# Patient Record
Sex: Male | Born: 1960 | Race: White | Hispanic: No | Marital: Married | Smoking: Never smoker
Health system: Southern US, Community
[De-identification: ages and names within clinical notes are randomized; demographics above are authoritative.]

## PROBLEM LIST (undated history)

## (undated) DIAGNOSIS — T7840XA Allergy, unspecified, initial encounter: Secondary | ICD-10-CM

## (undated) DIAGNOSIS — R040 Epistaxis: Secondary | ICD-10-CM

## (undated) DIAGNOSIS — R739 Hyperglycemia, unspecified: Secondary | ICD-10-CM

## (undated) DIAGNOSIS — N400 Enlarged prostate without lower urinary tract symptoms: Secondary | ICD-10-CM

## (undated) HISTORY — PX: ANTERIOR CRUCIATE LIGAMENT REPAIR: SHX115

## (undated) HISTORY — DX: Benign prostatic hyperplasia without lower urinary tract symptoms: N40.0

## (undated) HISTORY — PX: TONSILLECTOMY: SUR1361

## (undated) HISTORY — DX: Allergy, unspecified, initial encounter: T78.40XA

## (undated) HISTORY — DX: Hyperglycemia, unspecified: R73.9

## (undated) HISTORY — DX: Epistaxis: R04.0

---

## 1999-12-16 ENCOUNTER — Encounter (INDEPENDENT_AMBULATORY_CARE_PROVIDER_SITE_OTHER): Payer: Self-pay | Admitting: Internal Medicine

## 1999-12-16 LAB — CONVERTED CEMR LAB: WBC, blood: 6.5 10*3/uL

## 2004-02-17 ENCOUNTER — Emergency Department (HOSPITAL_COMMUNITY): Admission: EM | Admit: 2004-02-17 | Discharge: 2004-02-17 | Payer: Self-pay

## 2004-02-18 ENCOUNTER — Encounter: Admission: RE | Admit: 2004-02-18 | Discharge: 2004-02-18 | Payer: Self-pay | Admitting: Family Medicine

## 2007-06-19 ENCOUNTER — Ambulatory Visit: Payer: Self-pay | Admitting: Family Medicine

## 2007-06-19 DIAGNOSIS — F411 Generalized anxiety disorder: Secondary | ICD-10-CM | POA: Insufficient documentation

## 2007-06-25 ENCOUNTER — Ambulatory Visit: Payer: Self-pay | Admitting: Family Medicine

## 2007-06-25 ENCOUNTER — Telehealth: Payer: Self-pay | Admitting: Family Medicine

## 2007-06-25 LAB — CONVERTED CEMR LAB
Alkaline Phosphatase: 52 units/L (ref 39–117)
BUN: 16 mg/dL (ref 6–23)
Basophils Absolute: 0 10*3/uL (ref 0.0–0.1)
Bilirubin, Direct: 0.2 mg/dL (ref 0.0–0.3)
CO2: 29 meq/L (ref 19–32)
Chloride: 99 meq/L (ref 96–112)
Eosinophils Absolute: 0.1 10*3/uL (ref 0.0–0.6)
Glucose, Bld: 98 mg/dL (ref 70–99)
INR: 0.8 (ref 0.8–1.0)
Iron: 90 ug/dL (ref 42–165)
Lymphocytes Relative: 29.3 % (ref 12.0–46.0)
MCHC: 34.4 g/dL (ref 30.0–36.0)
MCV: 90.5 fL (ref 78.0–100.0)
Monocytes Absolute: 0.6 10*3/uL (ref 0.2–0.7)
Monocytes Relative: 9.6 % (ref 3.0–11.0)
Potassium: 4 meq/L (ref 3.5–5.1)
Prothrombin Time: 10.8 s — ABNORMAL LOW (ref 10.9–13.3)
RBC: 4.99 M/uL (ref 4.22–5.81)
Sodium: 135 meq/L (ref 135–145)
Total Protein: 7.2 g/dL (ref 6.0–8.3)
aPTT: 28.8 s (ref 21.7–29.8)

## 2007-07-30 ENCOUNTER — Ambulatory Visit: Payer: Self-pay | Admitting: Family Medicine

## 2007-07-31 ENCOUNTER — Encounter: Payer: Self-pay | Admitting: Internal Medicine

## 2007-07-31 DIAGNOSIS — R040 Epistaxis: Secondary | ICD-10-CM | POA: Insufficient documentation

## 2007-07-31 DIAGNOSIS — D239 Other benign neoplasm of skin, unspecified: Secondary | ICD-10-CM | POA: Insufficient documentation

## 2007-12-03 ENCOUNTER — Ambulatory Visit: Payer: Self-pay | Admitting: Family Medicine

## 2007-12-03 LAB — CONVERTED CEMR LAB
ALT: 28 units/L (ref 0–53)
BUN: 18 mg/dL (ref 6–23)
Basophils Relative: 0.7 % (ref 0.0–1.0)
Bilirubin, Direct: 0.1 mg/dL (ref 0.0–0.3)
CO2: 31 meq/L (ref 19–32)
Calcium: 8.9 mg/dL (ref 8.4–10.5)
Chloride: 111 meq/L (ref 96–112)
Creatinine, Ser: 1 mg/dL (ref 0.4–1.5)
Eosinophils Absolute: 0.1 10*3/uL (ref 0.0–0.7)
Eosinophils Relative: 1.8 % (ref 0.0–5.0)
GFR calc Af Amer: 104 mL/min
GFR calc non Af Amer: 86 mL/min
Glucose, Bld: 108 mg/dL — ABNORMAL HIGH (ref 70–99)
HCT: 44.4 % (ref 39.0–52.0)
HDL: 34.5 mg/dL — ABNORMAL LOW (ref >39.0)
Hemoglobin: 15.1 g/dL (ref 13.0–17.0)
MCHC: 34 g/dL (ref 30.0–36.0)
MCV: 88.1 fL (ref 78.0–100.0)
Monocytes Relative: 10.7 % (ref 3.0–12.0)
Potassium: 4.2 meq/L (ref 3.5–5.1)
RBC: 5.04 M/uL (ref 4.22–5.81)
Sodium: 143 meq/L (ref 135–145)
Total CHOL/HDL Ratio: 4.4
Triglycerides: 99 mg/dL (ref 0–149)
WBC: 4.9 10*3/uL (ref 4.5–10.5)

## 2007-12-05 ENCOUNTER — Ambulatory Visit: Payer: Self-pay | Admitting: Family Medicine

## 2007-12-05 DIAGNOSIS — R739 Hyperglycemia, unspecified: Secondary | ICD-10-CM | POA: Insufficient documentation

## 2010-02-23 ENCOUNTER — Encounter (INDEPENDENT_AMBULATORY_CARE_PROVIDER_SITE_OTHER): Payer: Self-pay | Admitting: *Deleted

## 2010-08-18 NOTE — Letter (Signed)
Summary: David Montes letter  [place] at [facility]  [address] [place], [place] [address]   Phone: [phone]  Fax: [phone]       02/23/2010 MRN: [id number]  David Montes [address] [place], [place]  [address]  Dear Mr. Babino,  [facility] [place] [place] and [place] announce the retirement of Arta Silence, M.D., from full-time practice at the [facility] office effective January 14, 2010 and his plans of returning part-time.  It is important to Dr. Hetty Ely and to our practice that you understand that [facility] has seven physicians in our office for your health care needs.  We will continue to offer the same exceptional care that you have today.    Dr. Hetty Ely has spoken to many of you about his plans for retirement and returning part-time in the fall.   We will continue to work with you through the transition to schedule appointments for you in the office and meet the high standards that [place] is committed to.   Again, it is with great pleasure that we share the news that Dr. Hetty Ely will return to [facility] in October of 2011 with a reduced schedule.    If you have any questions, or would like to request an appointment with one of our physicians, please call us at [phone] and press the option for Scheduling an appointment.  We take pleasure in providing you with excellent patient care and look forward to seeing you at your next office visit.  Our [facility] Physicians are:  Tillman Abide, M.D. Laurita Quint, M.D. Roxy Manns, M.D. Kerby Nora, M.D. Hannah Beat, M.D. Ruthe Mannan, M.D. We proudly welcomed Raechel Ache, M.D. and Eustaquio Boyden, M.D. to the practice in July/August 2011.  Sincerely,  [facility]

## 2011-09-26 ENCOUNTER — Encounter (HOSPITAL_COMMUNITY): Payer: Self-pay | Admitting: *Deleted

## 2011-09-26 ENCOUNTER — Emergency Department (HOSPITAL_COMMUNITY)
Admission: EM | Admit: 2011-09-26 | Discharge: 2011-09-27 | Disposition: A | Discharge status: Home / Self Care | Payer: BC Managed Care – PPO | Attending: Emergency Medicine | Admitting: Emergency Medicine

## 2011-09-26 ENCOUNTER — Emergency Department (HOSPITAL_COMMUNITY): Payer: BC Managed Care – PPO

## 2011-09-26 DIAGNOSIS — R509 Fever, unspecified: Secondary | ICD-10-CM | POA: Insufficient documentation

## 2011-09-26 DIAGNOSIS — B349 Viral infection, unspecified: Secondary | ICD-10-CM

## 2011-09-26 DIAGNOSIS — R05 Cough: Secondary | ICD-10-CM | POA: Insufficient documentation

## 2011-09-26 DIAGNOSIS — R059 Cough, unspecified: Secondary | ICD-10-CM | POA: Insufficient documentation

## 2011-09-26 DIAGNOSIS — B9789 Other viral agents as the cause of diseases classified elsewhere: Secondary | ICD-10-CM | POA: Insufficient documentation

## 2011-09-26 LAB — CBC
HCT: 44 % (ref 39.0–52.0)
MCH: 30.9 pg (ref 26.0–34.0)
MCV: 87.1 fL (ref 78.0–100.0)
RBC: 5.05 MIL/uL (ref 4.22–5.81)

## 2011-09-26 LAB — DIFFERENTIAL
Lymphs Abs: 1.3 10*3/uL (ref 0.7–4.0)
Monocytes Absolute: 1.7 10*3/uL — ABNORMAL HIGH (ref 0.1–1.0)
Monocytes Relative: 11 % (ref 3–12)
Neutro Abs: 12.5 10*3/uL — ABNORMAL HIGH (ref 1.7–7.7)

## 2011-09-26 MED ORDER — SODIUM CHLORIDE 0.9 % IV BOLUS (SEPSIS)
1000.0000 mL | Freq: Once | INTRAVENOUS | Status: AC
  Administered 2011-09-26: 1000 mL via INTRAVENOUS

## 2011-09-26 NOTE — ED Provider Notes (Signed)
History     CSN: [id number]  Arrival date & time 09/26/11  2125   First MD Initiated Contact with Patient 09/26/11 2211      Chief Complaint  Patient presents with  . Hypotension    [facility] due to drop in BP with change in position. pt reports "not feeling well" reports getting over a bug last week. denies vomiting or diarrhea.     (Consider location/radiation/quality/duration/timing/severity/associated sxs/prior treatment) HPI Patient states he began having cough, congestion, headache last Thursday.  Coworkers with similar symptoms.  Today, felt ok then , later in day, gave dextramathorphine and felt like fever spiked.  Went to bed, wife took to urgent care and told bp low and sent here.  Patient feels better now after given tylenol.     History reviewed. No pertinent past medical history.  Past Surgical History  Procedure Date  . Anterior cruciate ligament repair     History reviewed. No pertinent family history.  History  Substance Use Topics  . Smoking status: Not on file  . Smokeless tobacco: Not on file  . Alcohol Use:       Review of Systems  All other systems reviewed and are negative.    Allergies  Review of patient's allergies indicates no known allergies.  Home Medications  No current outpatient prescriptions on file.  BP 104/75  Pulse 104  Resp 18  Wt 164 lb 14.4 oz (74.798 kg)  SpO2 96%  Physical Exam  Nursing note and vitals reviewed. Constitutional: He appears well-developed and well-nourished.  HENT:  Head: Normocephalic and atraumatic.  Right Ear: External ear normal.  Left Ear: External ear normal.  Nose: Nose normal.  Mouth/Throat: Oropharynx is clear and moist.  Eyes: Conjunctivae are normal. Pupils are equal, round, and reactive to light.  Neck: Normal range of motion. Neck supple.  Cardiovascular: Regular rhythm.     ED Course  Procedures (including critical care time)  Labs Reviewed - No data to display No  results found.   No diagnosis found.    MDM  Patient with reported hypotension prior to arrival, however here he has been normotensive although slightly tachycardic. He has had history consistent with a viral syndrome. His white blood cell count is elevated. He is receiving normal saline here and a lactic acid level is pending. I discussed his care with Dr. Patria Mane and he will be discharged if his lactic acid level is normal      Hilario Quarry, MD 09/27/11 1504

## 2011-09-27 LAB — BASIC METABOLIC PANEL
BUN: 20 mg/dL (ref 6–23)
Calcium: 9.9 mg/dL (ref 8.4–10.5)
Chloride: 96 mEq/L (ref 96–112)
GFR calc non Af Amer: 81 mL/min — ABNORMAL LOW (ref >90)
Potassium: 3.8 mEq/L (ref 3.5–5.1)

## 2011-09-27 MED ORDER — SODIUM CHLORIDE 0.9 % IV BOLUS (SEPSIS)
1000.0000 mL | Freq: Once | INTRAVENOUS | Status: AC
  Administered 2011-09-27: 1000 mL via INTRAVENOUS

## 2011-09-27 MED ORDER — IBUPROFEN 200 MG PO TABS
600.0000 mg | ORAL_TABLET | Freq: Once | ORAL | Status: AC
  Administered 2011-09-27: 600 mg via ORAL
  Filled 2011-09-27: qty 3

## 2011-09-27 NOTE — ED Provider Notes (Signed)
1:32 AM The patient feels much better at this time.  He's been given 2 L of fluids.  His lactate is normal.  DC home in good condition.  1. Fever   2. Viral syndrome     Results for orders placed during the hospital encounter of 09/26/11  CBC      Component Value Range   WBC 15.5 (*) 4.0 - 10.5 (K/uL)   RBC 5.05  4.22 - 5.81 (MIL/uL)   Hemoglobin 15.6  13.0 - 17.0 (g/dL)   HCT 16.1  09.6 - 04.5 (%)   MCV 87.1  78.0 - 100.0 (fL)   MCH 30.9  26.0 - 34.0 (pg)   MCHC 35.5  30.0 - 36.0 (g/dL)   RDW 40.9  81.1 - 91.4 (%)   Platelets 185  150 - 400 (K/uL)  DIFFERENTIAL      Component Value Range   Neutrophils Relative 81 (*) 43 - 77 (%)   Neutro Abs 12.5 (*) 1.7 - 7.7 (K/uL)   Lymphocytes Relative 8 (*) 12 - 46 (%)   Lymphs Abs 1.3  0.7 - 4.0 (K/uL)   Monocytes Relative 11  3 - 12 (%)   Monocytes Absolute 1.7 (*) 0.1 - 1.0 (K/uL)   Eosinophils Relative 0  0 - 5 (%)   Eosinophils Absolute 0.1  0.0 - 0.7 (K/uL)   Basophils Relative 0  0 - 1 (%)   Basophils Absolute 0.0  0.0 - 0.1 (K/uL)  BASIC METABOLIC PANEL      Component Value Range   Sodium 134 (*) 135 - 145 (mEq/L)   Potassium 3.8  3.5 - 5.1 (mEq/L)   Chloride 96  96 - 112 (mEq/L)   CO2 27  19 - 32 (mEq/L)   Glucose, Bld 142 (*) 70 - 99 (mg/dL)   BUN 20  6 - 23 (mg/dL)   Creatinine, Ser 7.82  0.50 - 1.35 (mg/dL)   Calcium 9.9  8.4 - 95.6 (mg/dL)   GFR calc non Af Amer 81 (*) >90 (mL/min)   GFR calc Af Amer >90  >90 (mL/min)  LACTIC ACID, PLASMA      Component Value Range   Lactic Acid, Venous 1.2  0.5 - 2.2 (mmol/L)     Lyanne Co, MD 09/27/11 [phone]

## 2011-09-27 NOTE — Discharge Instructions (Signed)
Fever, Adult A fever is a higher than normal body temperature. In an adult, an oral temperature around 98.6 F (37 C) is considered normal. A temperature of 100.4 F (38 C) or higher is generally considered a fever. Mild or moderate fevers generally have no long-term effects and often do not require treatment. Extreme fever (greater than or equal to 106 F or 41.1 C) can cause seizures. The sweating that may occur with repeated or prolonged fever may cause dehydration. Elderly people can develop confusion during a fever. A measured temperature can vary with:  Age.   Time of day.   Method of measurement (mouth, underarm, rectal, or ear).  The fever is confirmed by taking a temperature with a thermometer. Temperatures can be taken different ways. Some methods are accurate and some are not.  An oral temperature is used most commonly. Electronic thermometers are fast and accurate.   An ear temperature will only be accurate if the thermometer is positioned as recommended by the manufacturer.   A rectal temperature is accurate and done for those adults who have a condition where an oral temperature cannot be taken.   An underarm (axillary) temperature is not accurate and not recommended.  Fever is a symptom, not a disease.  CAUSES   Infections commonly cause fever.   Some noninfectious causes for fever include:   Some arthritis conditions.   Some thyroid or adrenal gland conditions.   Some immune system conditions.   Some types of cancer.   A medicine reaction.   High doses of certain street drugs such as methamphetamine.   Dehydration.   Exposure to high outside or room temperatures.   Occasionally, the source of a fever cannot be determined. This is sometimes called a "fever of unknown origin" (FUO).   Some situations may lead to a temporary rise in body temperature that may go away on its own. Examples are:   Childbirth.   Surgery.   Intense exercise.  HOME CARE  INSTRUCTIONS   Take appropriate medicines for fever. Follow dosing instructions carefully. If you use acetaminophen to reduce the fever, be careful to avoid taking other medicines that also contain acetaminophen. Do not take aspirin for a fever if you are younger than age 19. There is an association with Reye's syndrome. Reye's syndrome is a rare but potentially deadly disease.   If an infection is present and antibiotics have been prescribed, take them as directed. Finish them even if you start to feel better.   Rest as needed.   Maintain an adequate fluid intake. To prevent dehydration during an illness with prolonged or recurrent fever, you may need to drink extra fluid.Drink enough fluids to keep your urine clear or pale yellow.   Sponging or bathing with room temperature water may help reduce body temperature. Do not use ice water or alcohol sponge baths.   Dress comfortably, but do not over-bundle.  SEEK MEDICAL CARE IF:   You are unable to keep fluids down.   You develop vomiting or diarrhea.   You are not feeling at least partly better after 3 days.   You develop new symptoms or problems.  SEEK IMMEDIATE MEDICAL CARE IF:   You have shortness of breath or trouble breathing.   You develop excessive weakness.   You are dizzy or you faint.   You are extremely thirsty or you are making little or no urine.   You develop new pain that was not there before (such as in the head,   neck, chest, back, or abdomen).   You have persistant vomiting and diarrhea for more than 1 to 2 days.   You develop a stiff neck or your eyes become sensitive to light.   You develop a skin rash.   You have a fever or persistent symptoms for more than 2 to 3 days.   You have a fever and your symptoms suddenly get worse.  MAKE SURE YOU:   Understand these instructions.   Will watch your condition.   Will get help right away if you are not doing well or get worse.  Document Released:  12/28/2000 Document Revised: 06/23/2011 Document Reviewed: 05/05/2011 ExitCare Patient Information 2012 [employer]. 

## 2011-12-16 ENCOUNTER — Emergency Department (HOSPITAL_COMMUNITY)
Admission: EM | Admit: 2011-12-16 | Discharge: 2011-12-16 | Disposition: A | Discharge status: Home / Self Care | Payer: BC Managed Care – PPO | Attending: Emergency Medicine | Admitting: Emergency Medicine

## 2011-12-16 ENCOUNTER — Encounter (HOSPITAL_COMMUNITY): Payer: Self-pay | Admitting: Emergency Medicine

## 2011-12-16 DIAGNOSIS — Z23 Encounter for immunization: Secondary | ICD-10-CM | POA: Insufficient documentation

## 2011-12-16 DIAGNOSIS — Z203 Contact with and (suspected) exposure to rabies: Secondary | ICD-10-CM | POA: Insufficient documentation

## 2011-12-16 MED ORDER — RABIES VACCINE, PCEC IM SUSR
1.0000 mL | Freq: Once | INTRAMUSCULAR | Status: AC
  Administered 2011-12-16: 1 mL via INTRAMUSCULAR
  Filled 2011-12-16: qty 1

## 2011-12-16 MED ORDER — RABIES IMMUNE GLOBULIN 150 UNIT/ML IM INJ
20.0000 [IU]/kg | INJECTION | Freq: Once | INTRAMUSCULAR | Status: AC
  Administered 2011-12-16: 1500 [IU]
  Filled 2011-12-16: qty 10

## 2011-12-16 NOTE — Discharge Instructions (Signed)
Rabies   You may have been exposed to rabies. It can be carried by skunks, bats, woodchucks, raccoons and foxes. It is less common in dogs; however this is one of the most common bites for which the rabies vaccine is given. When bitten by an infected animal, a person gets rabies from the infected saliva (spit) of the animal. Most people get sick 20 to 90 days after being bitten. This varies based on the location of the bite. The rabies virus affects the brain so the closer to the head the bite occurs, the less time it will take the illness to develop. Once rabies develops it almost always causes death. Because of this, it is often necessary to start treatment whether it is known if the animal is healthy or not.  If bitten by an animal and the animal can be observed to see if it remains healthy, often no further treatment is necessary other than care of the wounds caused by the animal. If the animal has been killed it can be sent into a state laboratory and the brain can be examined to see if the animal had rabies.  TREATMENT   There is no known treatment for rabies once the disease starts. This is a viral illness and antibiotics (medications which kill germs) do not help. This is why caregivers use extra caution and treat questionable bites with rabies vaccine to prevent the disease.  RABIES IMMUNIZATION SCHEDULE - POST-EXPOSURE  Be sure to record the dates of your injections for your records.  1st Injection Date________________________  Day 3__________________________________  Day 7__________________________________  Day 14_________________________________  HOME CARE INSTRUCTIONS   If you have received a bite from an unknown animal, make sure you know the instructions for follow up. If the animal was sent to a laboratory for examination, make sure you know how you are to obtain your results.   Keep wound clean, dry and dressed as suggested by your caregiver.   Take antibiotics as directed and finish the  prescription, even if the wound appears OK.   Keep injured part elevated as much as possible.   Do not resume use of the affected area until directed.   Only take over-the-counter or prescription medicines for pain, discomfort, or fever as directed by your caregiver.  SEEK IMMEDIATE MEDICAL CARE IF:    You have a fever.   There is nausea (feeling sick to your stomach), vomiting, chills or a high temperature.   There is unusual behavior including hyperactivity, fear of water (hydrophobia), or fear of air (aerophobia).   If pain prevents movement of any joint.   If you are unable to move a finger or toe.   The wound becomes more inflamed and swollen, or has a purulent (pus-like) discharge.   You notice that there is a foreign substance, such as cloth or a tooth, in the wound.   If a red line develops at the site of the wound and begins to move up the arm or leg.  Document Released: 07/04/2005 Document Revised: 06/23/2011 Document Reviewed: 10/09/2008  ExitCare Patient Information 2012 [employer].

## 2011-12-16 NOTE — ED Provider Notes (Signed)
History     CSN: [id number]  Arrival date & time 12/16/11  1532   First MD Initiated Contact with Patient 12/16/11 1559      Chief Complaint  Patient presents with  . Rabies Injection    (Consider location/radiation/quality/duration/timing/severity/associated sxs/prior treatment) HPI Comments: Patient here with wife and sons - he states that on Thursday evening he and his wife awoke to their dogs barking and noted a black object flying in the room, when they turned on the lights it was noted to be a bat.  They have called animal control who has looked through the home and did not find a bat.  They called their PCP and CDC and it was recommended they get the rabies vaccine and immunuglobin.    History reviewed. No pertinent past medical history.  Past Surgical History  Procedure Date  . Anterior cruciate ligament repair     History reviewed. No pertinent family history.  History  Substance Use Topics  . Smoking status: Not on file  . Smokeless tobacco: Not on file  . Alcohol Use:       Review of Systems  Constitutional: Negative for fever and chills.  HENT: Negative for neck pain and neck stiffness.   Eyes: Negative for pain.  Respiratory: Negative for chest tightness and shortness of breath.   Cardiovascular: Negative for chest pain.  Gastrointestinal: Negative for nausea, vomiting and abdominal pain.  Genitourinary: Negative for dysuria.  Musculoskeletal: Negative for back pain.  Skin: Negative for rash and wound.  Neurological: Negative for seizures, speech difficulty, numbness and headaches.  All other systems reviewed and are negative.    Allergies  Review of patient's allergies indicates no known allergies.  Home Medications  No current outpatient prescriptions on file.  BP 123/79  Pulse 87  Temp(Src) 97 F (36.1 C) (Oral)  Resp 20  Wt 163 lb (73.936 kg)  SpO2 98%  Physical Exam  Nursing note and vitals reviewed. Constitutional: He is oriented  to person, place, and time. He appears well-developed and well-nourished. No distress.  HENT:  Head: Normocephalic and atraumatic.  Right Ear: External ear normal.  Left Ear: External ear normal.  Nose: Nose normal.  Mouth/Throat: Oropharynx is clear and moist. No oropharyngeal exudate.  Eyes: Conjunctivae are normal. Pupils are equal, round, and reactive to light. No scleral icterus.  Neck: Normal range of motion. Neck supple.  Cardiovascular: Normal rate, regular rhythm and normal heart sounds.  Exam reveals no gallop and no friction rub.   No murmur heard. Pulmonary/Chest: Effort normal and breath sounds normal. No respiratory distress. He has no wheezes. He has no rales. He exhibits no tenderness.  Abdominal: Soft. Bowel sounds are normal. He exhibits no distension. There is no tenderness.  Musculoskeletal: Normal range of motion. He exhibits no edema and no tenderness.  Lymphadenopathy:    He has no cervical adenopathy.  Neurological: He is alert and oriented to person, place, and time. No cranial nerve deficit.  Skin: Skin is warm and dry. No rash noted. No erythema. No pallor.  Psychiatric: He has a normal mood and affect. His behavior is normal. Judgment and thought content normal.    ED Course  Procedures (including critical care time)  Labs Reviewed - No data to display No results found.   Exposure to rabies    MDM  Patient here with exposure to bat in home.  Though they did not find the bat, it has been discussed with the family and they have  opted to get the rabies vaccine and IG injections.  They will follow up at [facility] for remainder of the course.        Izola Price [place], [place] 12/16/11 [phone]

## 2011-12-16 NOTE — ED Notes (Signed)
Two mornings ago patient discovered a bat in bedroom, at this time they can not locate the bat.  Pt denies being biten by bat.

## 2011-12-16 NOTE — ED Notes (Signed)
Faxed Rabies schedule to Peds, [facility], and Pharmacy

## 2011-12-17 NOTE — ED Provider Notes (Signed)
Medical screening examination/treatment/procedure(s) were performed by non-physician practitioner and as supervising physician I was immediately available for consultation/collaboration.  Arley Phenix, MD 12/17/11 [phone]

## 2011-12-19 ENCOUNTER — Encounter (HOSPITAL_COMMUNITY): Payer: Self-pay | Admitting: *Deleted

## 2011-12-19 ENCOUNTER — Emergency Department (INDEPENDENT_AMBULATORY_CARE_PROVIDER_SITE_OTHER)
Admission: EM | Admit: 2011-12-19 | Discharge: 2011-12-19 | Disposition: A | Discharge status: Home / Self Care | Payer: BC Managed Care – PPO | Source: Home / Self Care

## 2011-12-19 DIAGNOSIS — Z23 Encounter for immunization: Secondary | ICD-10-CM

## 2011-12-19 MED ORDER — RABIES VACCINE, PCEC IM SUSR
INTRAMUSCULAR | Status: AC
  Filled 2011-12-19: qty 1

## 2011-12-19 MED ORDER — RABIES VACCINE, PCEC IM SUSR
1.0000 mL | Freq: Once | INTRAMUSCULAR | Status: AC
  Administered 2011-12-19: 1 mL via INTRAMUSCULAR

## 2011-12-19 NOTE — ED Notes (Signed)
Here for 2nd rabies vaccine for bat exposure.

## 2011-12-19 NOTE — Discharge Instructions (Signed)
Call if any problems.  Return 6/7 at 1630.

## 2011-12-23 ENCOUNTER — Encounter (HOSPITAL_COMMUNITY): Payer: Self-pay | Admitting: *Deleted

## 2011-12-23 ENCOUNTER — Emergency Department (HOSPITAL_COMMUNITY)
Admission: EM | Admit: 2011-12-23 | Discharge: 2011-12-23 | Disposition: A | Discharge status: Home / Self Care | Payer: BC Managed Care – PPO | Source: Home / Self Care

## 2011-12-23 DIAGNOSIS — Z23 Encounter for immunization: Secondary | ICD-10-CM

## 2011-12-23 MED ORDER — RABIES VACCINE, PCEC IM SUSR
INTRAMUSCULAR | Status: AC
  Filled 2011-12-23: qty 1

## 2011-12-23 MED ORDER — RABIES VACCINE, PCEC IM SUSR
1.0000 mL | Freq: Once | INTRAMUSCULAR | Status: AC
  Administered 2011-12-23: 1 mL via INTRAMUSCULAR

## 2011-12-23 NOTE — ED Notes (Signed)
Here for third rabies vaccine for bat exposure. C/o some fatigue. I told him that could be from the vaccine.

## 2011-12-23 NOTE — Discharge Instructions (Signed)
Call if any problems.  Return 6/14 @ 1630 for last vaccine.

## 2011-12-30 ENCOUNTER — Encounter (HOSPITAL_COMMUNITY): Payer: Self-pay | Admitting: *Deleted

## 2011-12-30 ENCOUNTER — Emergency Department (INDEPENDENT_AMBULATORY_CARE_PROVIDER_SITE_OTHER)
Admission: EM | Admit: 2011-12-30 | Discharge: 2011-12-30 | Disposition: A | Discharge status: Home / Self Care | Payer: BC Managed Care – PPO | Source: Home / Self Care

## 2011-12-30 DIAGNOSIS — Z23 Encounter for immunization: Secondary | ICD-10-CM

## 2011-12-30 MED ORDER — RABIES VACCINE, PCEC IM SUSR
INTRAMUSCULAR | Status: AC
  Filled 2011-12-30: qty 1

## 2011-12-30 MED ORDER — RABIES VACCINE, PCEC IM SUSR: 1.0000 mL | Freq: Once | INTRAMUSCULAR | Status: DC

## 2011-12-30 NOTE — ED Notes (Signed)
Here for last rabies vaccine for bat exposure. No previous problems with injections. 

## 2011-12-30 NOTE — Discharge Instructions (Signed)
Congratulations, you have finished your rabies series.  Call if any problems.  If any further rabies exposures, go to the ED and get rabies titer drawn.  If low you would need a booster shot.

## 2012-12-18 IMAGING — CR DG CHEST 2V
2 series · 2 of 2 positions shown · non-contrast
Comparison: None.

CLINICAL DATA: Cough, flu-like symptoms

CHEST - 2 VIEW

[w chest pa]
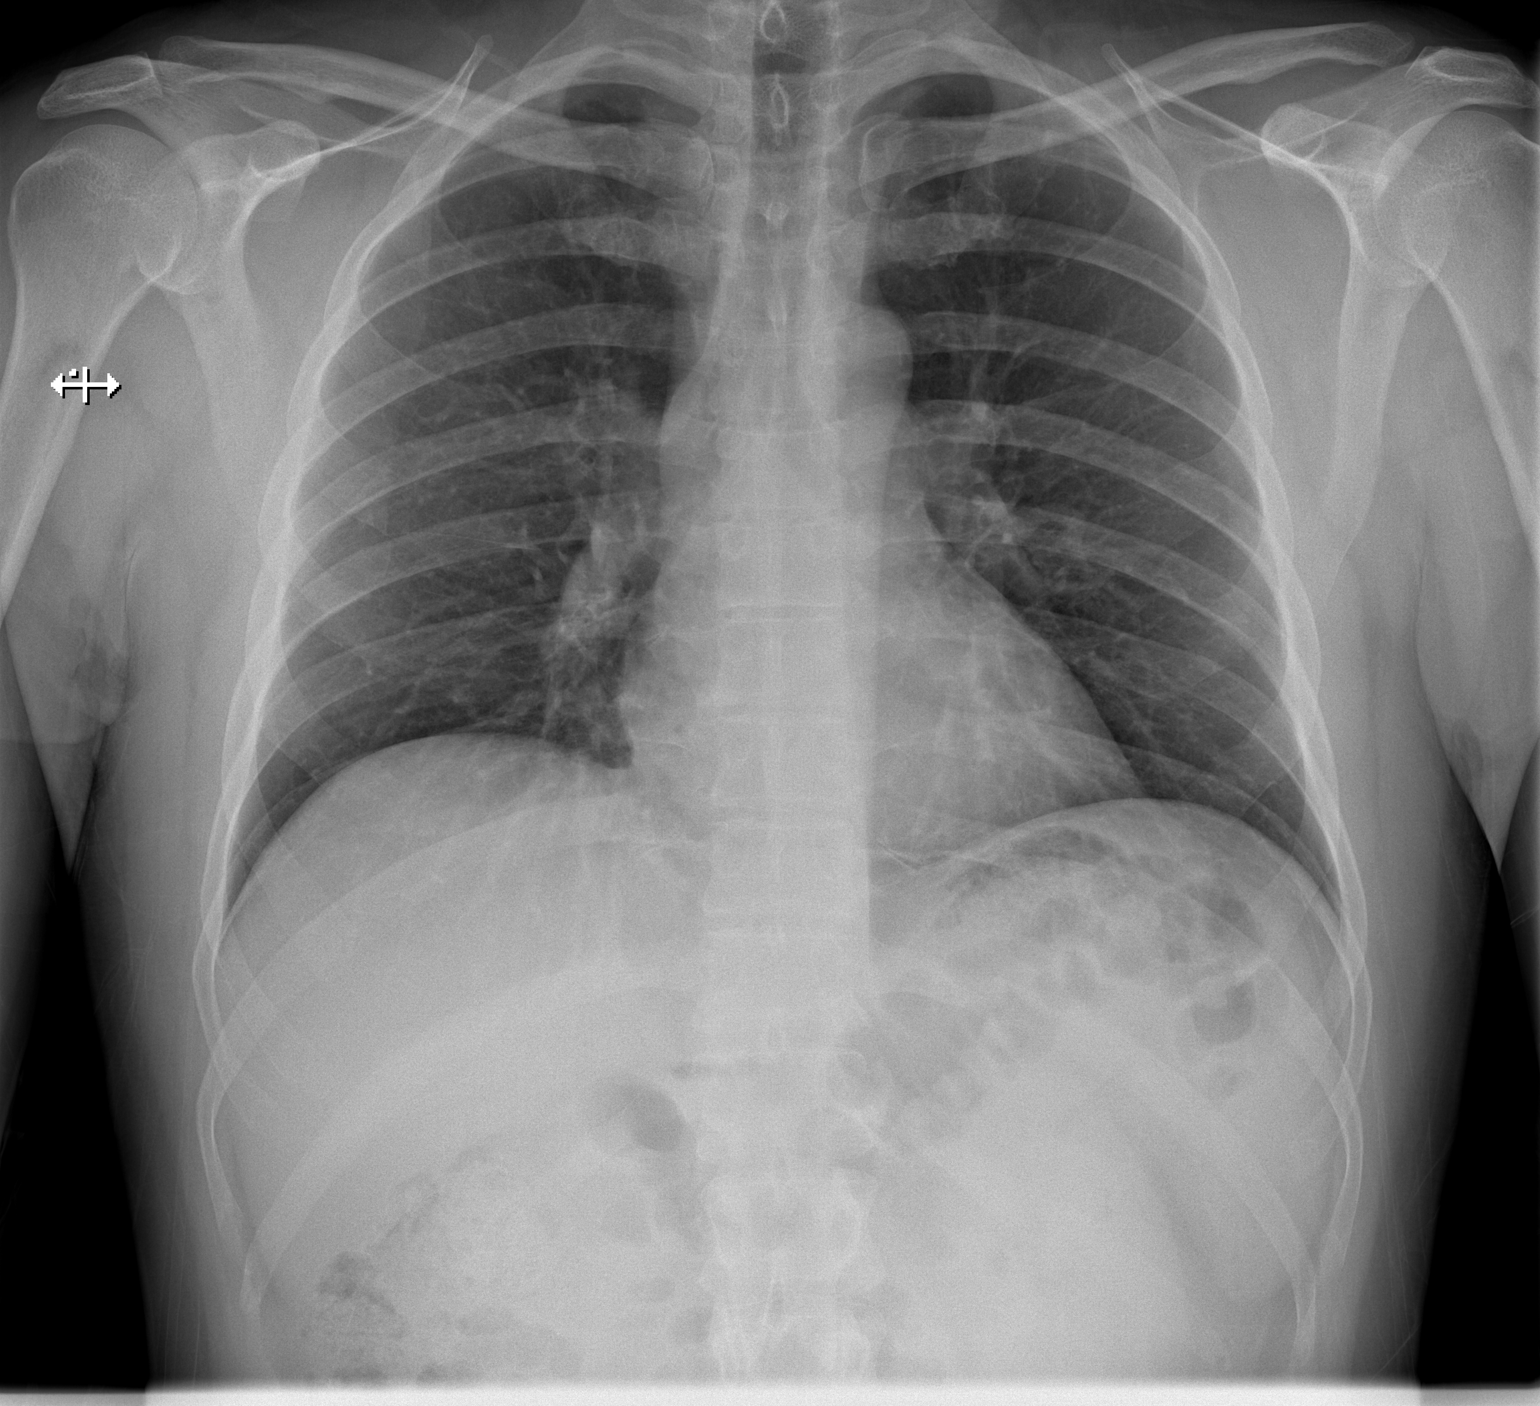

[w chest lat]
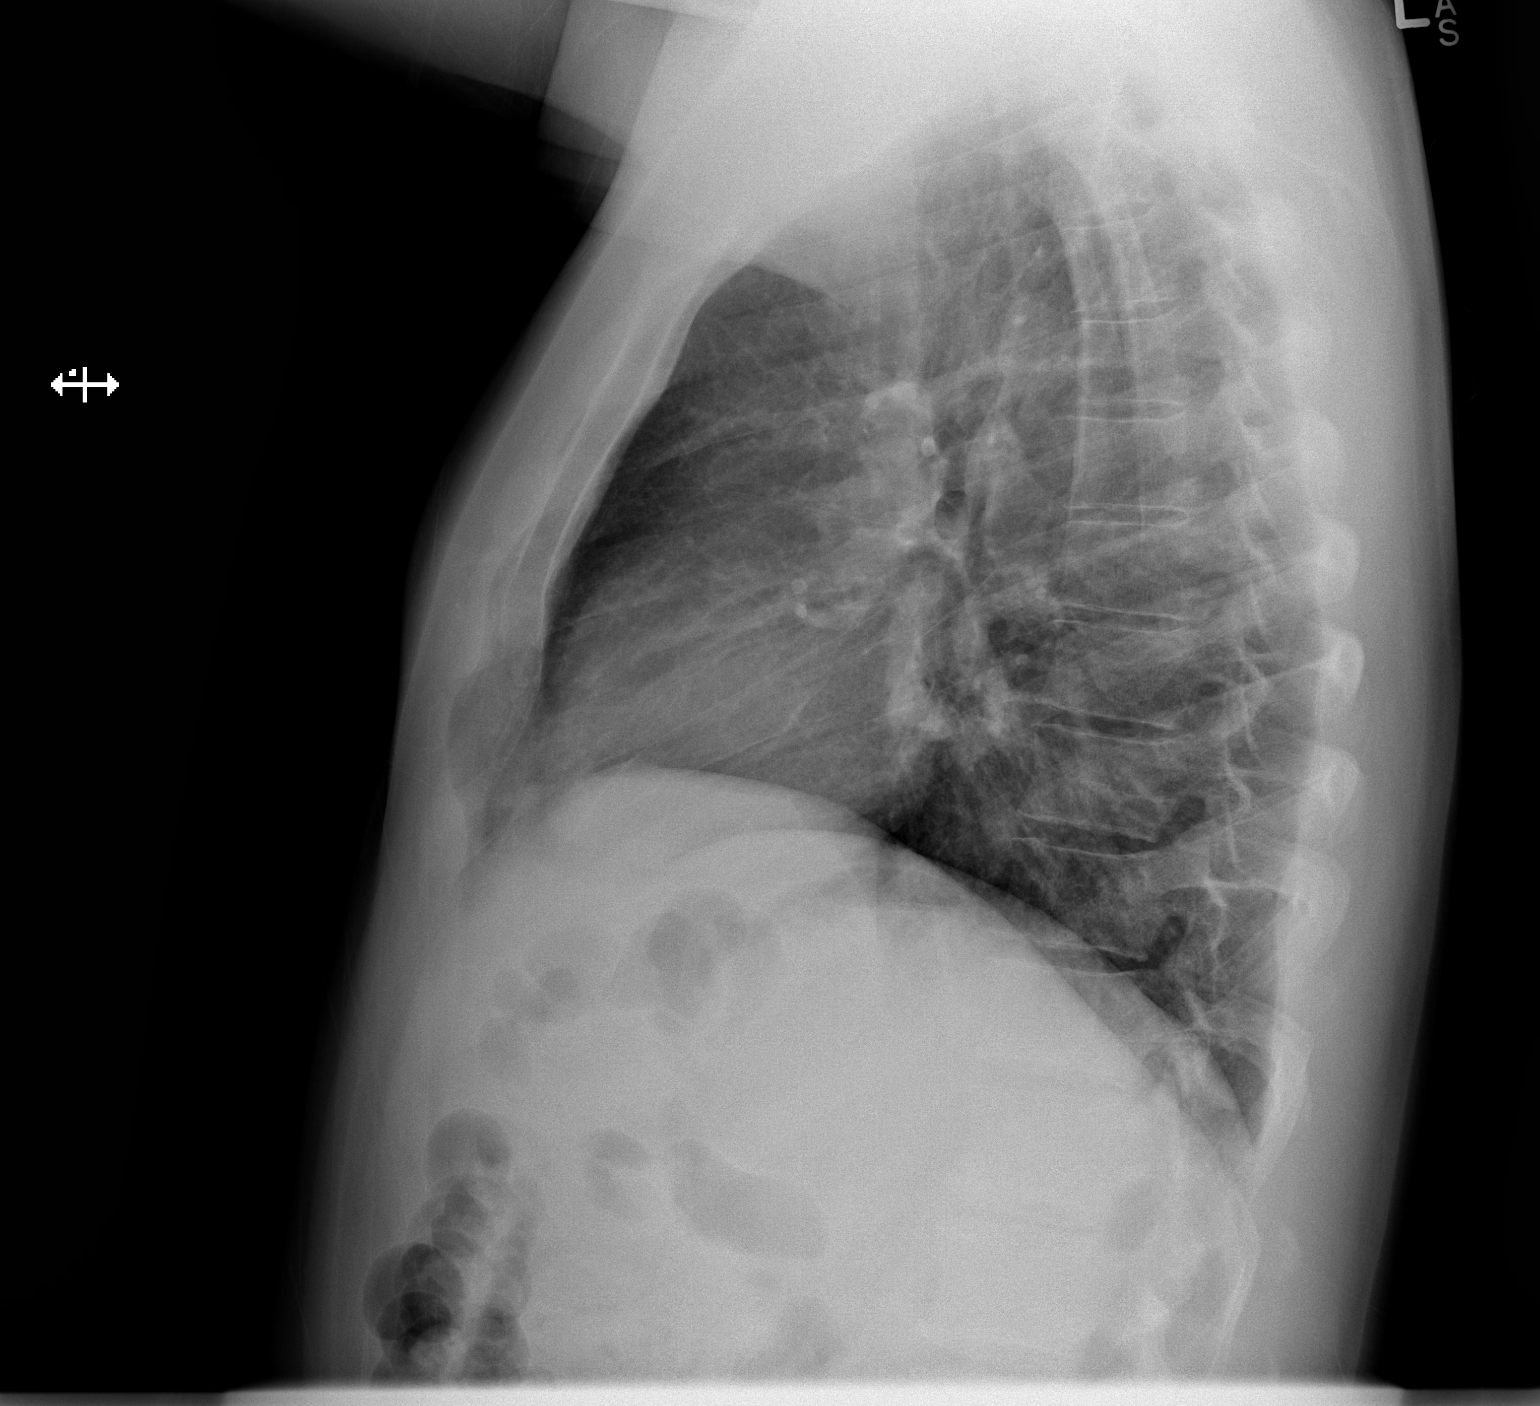

[2 of 2 positions shown; findings below may reference images not displayed]

FINDINGS: No focal consolidation.  No pleural effusion or
pneumothorax.  No acute osseous abnormality.  Cardiomediastinal
contours within normal limits.
IMPRESSION: No acute process identified.

## 2014-05-23 ENCOUNTER — Ambulatory Visit: Payer: BC Managed Care – PPO | Admitting: Family Medicine

## 2014-06-04 ENCOUNTER — Ambulatory Visit: Payer: BC Managed Care – PPO | Admitting: Family Medicine

## 2014-06-06 ENCOUNTER — Ambulatory Visit (INDEPENDENT_AMBULATORY_CARE_PROVIDER_SITE_OTHER): Payer: BC Managed Care – PPO | Admitting: Family Medicine

## 2014-06-06 ENCOUNTER — Encounter: Payer: Self-pay | Admitting: Family Medicine

## 2014-06-06 VITALS — BP 118/82 | Temp 97.7°F | Ht 68.0 in | Wt 168.0 lb

## 2014-06-06 DIAGNOSIS — N400 Enlarged prostate without lower urinary tract symptoms: Secondary | ICD-10-CM | POA: Insufficient documentation

## 2014-06-06 DIAGNOSIS — Z0000 Encounter for general adult medical examination without abnormal findings: Secondary | ICD-10-CM | POA: Diagnosis not present

## 2014-06-06 DIAGNOSIS — Z1211 Encounter for screening for malignant neoplasm of colon: Secondary | ICD-10-CM

## 2014-06-06 DIAGNOSIS — Z23 Encounter for immunization: Secondary | ICD-10-CM

## 2014-06-06 DIAGNOSIS — IMO0001 Reserved for inherently not codable concepts without codable children: Secondary | ICD-10-CM

## 2014-06-06 DIAGNOSIS — F411 Generalized anxiety disorder: Secondary | ICD-10-CM | POA: Insufficient documentation

## 2014-06-06 NOTE — Patient Instructions (Addendum)
53 y.o. male presenting for annual physical.  Health Maintenance counseling: 1. Anticipatory guidance: Patient counseled regarding regular dental exams, wearing seatbelts, using sunscreen 2. Risk factor reduction:  Advised patient of need for regular exercise (150 minutes a week) and diet rich and fruits and vegetables to reduce risk of heart attack and stroke.  3. Immunizations/screenings/ancillary studies Health Maintenance Due  Topic Date Due  . TETANUS/TDAP -today 01/28/2009  . COLONOSCOPY - refer and they will call 02/12/2013  4. Labs-come back at your convenience in the morning (black coffee or water only before visit)  Make a lab appointment before you go

## 2014-06-06 NOTE — Progress Notes (Signed)
David Reddish, MD Phone: [phone]  Subjective:  Patient presents today to establish care with me as their new primary care provider. Patient was formerly a patient of Dr. Council Mechanic. Chief complaint-noted.   Patient has not been seen for annual physical in several years. He states he has been doing well. He admits there is some room to improve with exercise. He states he had an enlarged prostate per exam when he was around 53 years old. He states occasionally he has mild hesitancy, otherwise see ROS. He does admit to some problems with epistaxis in 2009 but he has had no issues since that time. There is also a question about hyperglycemia but it is unclear  If his labs were fasting when this was reported.  ROS- no nocturia, polyuria, dysuria, urgency. Full ROS completed and negative except as noted above.  The following were reviewed and entered/updated in epic: Past Medical History  Diagnosis Date  . Epistaxis 07/31/2007    2009. 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 Moisturized/healed up     . Enlarged prostate 06/06/2014    On physical per patient at age 42   . Hyperglycemia 12/05/2007   Patient Active Problem List   Diagnosis Date Noted  . Epistaxis 07/31/2007    Priority: Medium  . Anxiety state 06/06/2014    Priority: Low  . Enlarged prostate 06/06/2014    Priority: Low  . Hyperglycemia 12/05/2007    Priority: Low   Past Surgical History  Procedure Laterality Date  . Anterior cruciate ligament repair      Family History  Problem Relation Age of Onset  . Graves' disease Mother   . Lung cancer Father     smoker    Medications- reviewed and updated Current Outpatient Prescriptions  Medication Sig Dispense Refill  . ibuprofen (ADVIL,MOTRIN) 200 MG tablet Take 400 mg by mouth once.     No current facility-administered medications for this visit.    Allergies-reviewed and updated No Known Allergies  History   Social History  . Marital Status:  Married    Spouse Name: N/A    Number of Children: N/A  . Years of Education: N/A   Social History Main Topics  . Smoking status: Never Smoker   . Smokeless tobacco: None  . Alcohol Use: Yes     Comment: occ  . Drug Use: No  . Sexual Activity: None   Other Topics Concern  . None   Social History Narrative   Married. 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       Hobbies: watching son play, occasional golf   Walk in neighborhood          ROS--See HPI   Objective: BP 118/82 mmHg  Temp(Src) 97.7 F (36.5 C)  Ht 5\' 8"  (1.727 m)  Wt 168 lb (76.204 kg)  BMI 25.55 kg/m2 Gen: NAD, resting comfortably on table HEENT: Mucous membranes are moist. Oropharynx normal. Good dentition.  Neck: no thyromegaly CV: RRR no murmurs rubs or gallops Lungs: CTAB no crackles, wheeze, rhonchi Abdomen: soft/nontender/nondistended/normal bowel sounds. No rebound or guarding.  Ext: no edema, 2+ PT pulses Skin: warm, dry, no rash Neuro: grossly normal, moves all extremities, PERRLA   Assessment/Plan:  53 y.o. male presenting for annual physical.  Health Maintenance counseling: 1. Anticipatory guidance: Patient  counseled regarding regular dental exams, wearing seatbelts, using sunscreen 2. Risk factor reduction:  Advised patient of need for regular exercise (150 minutes a week) and diet rich and fruits and vegetables to reduce risk of heart attack and stroke.  3. Immunizations/screenings/ancillary studies Health Maintenance Due  Topic Date Due  . TETANUS/TDAP -today 01/28/2009  . COLONOSCOPY - refer and they will call 02/12/2013  4. Return for fasting labs 5. Opts out of PSA testing. We will repeat discussion at each physical.   1-2 year follow up if no lab abnormalities  Future fasting labs Orders Placed This Encounter  Procedures  . CBC      [place]    Standing Status: Future     Number of Occurrences:      Standing Expiration Date: 06/07/2015  . Comprehensive metabolic panel    [place]    Standing Status: Future     Number of Occurrences:      Standing Expiration Date: 06/07/2015    Order Specific Question:  Has the patient fasted?    Answer:  No  . Lipid panel    [place]    Standing Status: Future     Number of Occurrences:      Standing Expiration Date: 06/07/2015    Order Specific Question:  Has the patient fasted?    Answer:  No  . TSH        Standing Status: Future     Number of Occurrences:      Standing Expiration Date: 06/07/2015  . Ambulatory referral to Gastroenterology    Referral Priority:  Routine    Referral Type:  Consultation    Referral Reason:  Specialty Services Required    Requested Specialty:  Gastroenterology    Number of Visits Requested:  1

## 2014-06-11 ENCOUNTER — Encounter: Payer: Self-pay | Admitting: Family Medicine

## 2014-06-11 ENCOUNTER — Other Ambulatory Visit (INDEPENDENT_AMBULATORY_CARE_PROVIDER_SITE_OTHER): Payer: BC Managed Care – PPO

## 2014-06-11 DIAGNOSIS — Z0000 Encounter for general adult medical examination without abnormal findings: Secondary | ICD-10-CM

## 2014-06-11 DIAGNOSIS — E785 Hyperlipidemia, unspecified: Secondary | ICD-10-CM | POA: Insufficient documentation

## 2014-06-11 DIAGNOSIS — IMO0001 Reserved for inherently not codable concepts without codable children: Secondary | ICD-10-CM

## 2014-06-11 LAB — CBC
HCT: 46.5 % (ref 39.0–52.0)
HEMOGLOBIN: 15.5 g/dL (ref 13.0–17.0)
MCHC: 33.4 g/dL (ref 30.0–36.0)
MCV: 90.1 fl (ref 78.0–100.0)
Platelets: 221 10*3/uL (ref 150.0–400.0)
RBC: 5.16 Mil/uL (ref 4.22–5.81)
RDW: 13 % (ref 11.5–15.5)
WBC: 5.1 10*3/uL (ref 4.0–10.5)

## 2014-06-11 LAB — COMPREHENSIVE METABOLIC PANEL
ALBUMIN: 4.3 g/dL (ref 3.5–5.2)
ALT: 30 U/L (ref 0–53)
AST: 30 U/L (ref 0–37)
Alkaline Phosphatase: 55 U/L (ref 39–117)
BILIRUBIN TOTAL: 1.2 mg/dL (ref 0.2–1.2)
BUN: 23 mg/dL (ref 6–23)
CO2: 28 meq/L (ref 19–32)
CREATININE: 1.1 mg/dL (ref 0.4–1.5)
Calcium: 9.1 mg/dL (ref 8.4–10.5)
Chloride: 105 mEq/L (ref 96–112)
GFR: 77.34 mL/min (ref >60.00)
Glucose, Bld: 100 mg/dL — ABNORMAL HIGH (ref 70–99)
Potassium: 4.1 mEq/L (ref 3.5–5.1)
Sodium: 140 mEq/L (ref 135–145)
Total Protein: 7 g/dL (ref 6.0–8.3)

## 2014-06-11 LAB — LIPID PANEL
CHOLESTEROL: 180 mg/dL (ref 0–200)
HDL: 45 mg/dL (ref >39.00)
LDL CALC: 117 mg/dL — AB (ref 0–99)
NonHDL: 135
TRIGLYCERIDES: 88 mg/dL (ref 0.0–149.0)
Total CHOL/HDL Ratio: 4
VLDL: 17.6 mg/dL (ref 0.0–40.0)

## 2014-06-11 LAB — TSH: TSH: 1.24 u[IU]/mL (ref 0.35–4.50)

## 2016-05-18 NOTE — Telephone Encounter (Signed)
error 

## 2016-07-05 DIAGNOSIS — Z1211 Encounter for screening for malignant neoplasm of colon: Secondary | ICD-10-CM | POA: Diagnosis not present

## 2016-07-05 DIAGNOSIS — Z01818 Encounter for other preprocedural examination: Secondary | ICD-10-CM | POA: Diagnosis not present

## 2017-07-03 ENCOUNTER — Ambulatory Visit (INDEPENDENT_AMBULATORY_CARE_PROVIDER_SITE_OTHER): Payer: BLUE CROSS/BLUE SHIELD | Admitting: Internal Medicine

## 2017-07-03 ENCOUNTER — Encounter: Payer: Self-pay | Admitting: Internal Medicine

## 2017-07-03 VITALS — BP 138/88 | HR 68 | Temp 97.5°F | Ht 67.0 in | Wt 165.5 lb

## 2017-07-03 DIAGNOSIS — Z1211 Encounter for screening for malignant neoplasm of colon: Secondary | ICD-10-CM | POA: Diagnosis not present

## 2017-07-03 DIAGNOSIS — Z0000 Encounter for general adult medical examination without abnormal findings: Secondary | ICD-10-CM | POA: Diagnosis not present

## 2017-07-03 DIAGNOSIS — R432 Parageusia: Secondary | ICD-10-CM | POA: Insufficient documentation

## 2017-07-03 LAB — CBC
HEMATOCRIT: 47 % (ref 39.0–52.0)
Hemoglobin: 15.6 g/dL (ref 13.0–17.0)
MCHC: 33.3 g/dL (ref 30.0–36.0)
MCV: 91.9 fl (ref 78.0–100.0)
Platelets: 221 10*3/uL (ref 150.0–400.0)
RBC: 5.11 Mil/uL (ref 4.22–5.81)
RDW: 13 % (ref 11.5–15.5)
WBC: 5.2 10*3/uL (ref 4.0–10.5)

## 2017-07-03 LAB — COMPREHENSIVE METABOLIC PANEL
ALT: 33 U/L (ref 0–53)
AST: 23 U/L (ref 0–37)
Albumin: 4.1 g/dL (ref 3.5–5.2)
Alkaline Phosphatase: 52 U/L (ref 39–117)
BILIRUBIN TOTAL: 0.6 mg/dL (ref 0.2–1.2)
BUN: 17 mg/dL (ref 6–23)
CHLORIDE: 104 meq/L (ref 96–112)
CO2: 31 meq/L (ref 19–32)
CREATININE: 1.01 mg/dL (ref 0.40–1.50)
Calcium: 8.9 mg/dL (ref 8.4–10.5)
GFR: 81.7 mL/min (ref >60.00)
Glucose, Bld: 87 mg/dL (ref 70–99)
Potassium: 3.9 mEq/L (ref 3.5–5.1)
SODIUM: 139 meq/L (ref 135–145)
Total Protein: 6.5 g/dL (ref 6.0–8.3)

## 2017-07-03 NOTE — Assessment & Plan Note (Signed)
Goes back at least 2 years Will recheck labs Try vitamin supplement

## 2017-07-03 NOTE — Progress Notes (Signed)
Subjective:    Patient ID: David Montes, male    DOB: 1960-12-20, 56 y.o.   MRN: [id number]  HPI Here to establish care and for physical Moved back to [facility] recently  No real concerns He notices that things don't taste good lately Did see ENT in past---no findings Discussed trying a multivitamin with zinc---for a month or so  Two friends recently with prostate cancer  Current Outpatient Medications on File Prior to Visit  Medication Sig Dispense Refill  . ibuprofen (ADVIL,MOTRIN) 200 MG tablet Take 400 mg by mouth once.     No current facility-administered medications on file prior to visit.     No Known Allergies  Past Medical History:  Diagnosis Date  . Enlarged prostate 06/06/2014   On physical per patient at age 35   . Epistaxis 07/31/2007   2009. 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 Moisturized/healed up     . Hyperglycemia 12/05/2007    Past Surgical History:  Procedure Laterality Date  . ANTERIOR CRUCIATE LIGAMENT REPAIR      Family History  Problem Relation Age of Onset  . Graves' disease Mother   . Lung cancer Father        smoker  . Diabetes Maternal Grandmother   . Heart disease Paternal Grandmother        CHF    Social History   Socioeconomic History  . Marital status: Married    Spouse name: Not on file  . Number of children: 2  . Years of education: Not on file  . Highest education level: Not on file  Social Needs  . Financial resource strain: Not on file  . Food insecurity - worry: Not on file  . Food insecurity - inability: Not on file  . Transportation needs - medical: Not on file  . Transportation needs - non-medical: Not on file  Occupational History  . Occupation: Outside Press photographer    Comment: Paint brushes  Tobacco Use  . Smoking status: Never Smoker  . Smokeless tobacco: Never Used  Substance and Sexual Activity  . Alcohol use: Yes    Comment: occ  . Drug use: No  . Sexual activity: Yes  Other Topics  Concern  . Not on file  Social History Narrative      Review of Systems  Constitutional: Negative for fatigue and unexpected weight change.       Walks dogs--- 2-3 miles  Wears seat belt  HENT: Negative for dental problem, hearing loss, tinnitus and trouble swallowing.        Keeps up with dentist  Eyes: Negative for visual disturbance.       No diplopia or unilateral vision loss  Respiratory: Negative for cough, chest tightness and shortness of breath.   Cardiovascular: Negative for chest pain, palpitations and leg swelling.  Gastrointestinal: Negative for blood in stool, constipation and nausea.       Some indigestion with sausage  Endocrine: Negative for polydipsia and polyuria.  Genitourinary: Negative for difficulty urinating and urgency.       No sexual problems  Musculoskeletal: Negative for arthralgias, back pain and joint swelling.  Skin: Negative for rash.       Eczema as child  Allergic/Immunologic: Positive for environmental allergies. Negative for immunocompromised state.       Occasional mild fall symptoms  Neurological: Negative for dizziness, syncope, light-headedness and headaches.  Hematological: Negative for adenopathy. Does not bruise/bleed easily.  Psychiatric/Behavioral: Negative for dysphoric mood and sleep disturbance.  The patient is not nervous/anxious.        Objective:   Physical Exam  Constitutional: He is oriented to person, place, and time. He appears well-developed. No distress.  HENT:  Head: Normocephalic and atraumatic.  Right Ear: External ear normal.  Left Ear: External ear normal.  Mouth/Throat: Oropharynx is clear and moist. No oropharyngeal exudate.  Eyes: Conjunctivae are normal. Pupils are equal, round, and reactive to light.  Neck: Normal range of motion. No thyromegaly present.  Cardiovascular: Normal rate, regular rhythm, normal heart sounds and intact distal pulses. Exam reveals no gallop.  No murmur heard. Pulmonary/Chest: Effort  normal and breath sounds normal. No respiratory distress. He has no wheezes. He has no rales.  Abdominal: Soft. He exhibits no distension. There is no tenderness. There is no rebound and no guarding.  Musculoskeletal: He exhibits no edema or tenderness.  Lymphadenopathy:    He has no cervical adenopathy.  Neurological: He is alert and oriented to person, place, and time.  Skin: No rash noted. No erythema.  Many benign nevi--recommended yearly derm (he has gone in past)  Psychiatric: He has a normal mood and affect. His behavior is normal.          Assessment & Plan:

## 2017-07-03 NOTE — Assessment & Plan Note (Signed)
Healthy Discussed flu vaccine--he prefers not Will set up colon again Defer PSA Discussed fitness--- resistance and aerobic

## 2017-07-04 ENCOUNTER — Encounter: Payer: Self-pay | Admitting: *Deleted

## 2017-08-28 ENCOUNTER — Other Ambulatory Visit: Payer: Self-pay

## 2017-08-28 ENCOUNTER — Encounter: Payer: Self-pay | Admitting: Family Medicine

## 2017-08-28 ENCOUNTER — Ambulatory Visit (INDEPENDENT_AMBULATORY_CARE_PROVIDER_SITE_OTHER): Payer: BLUE CROSS/BLUE SHIELD | Admitting: Family Medicine

## 2017-08-28 VITALS — BP 118/70 | HR 80 | Temp 98.2°F | Ht 67.5 in | Wt 168.0 lb

## 2017-08-28 DIAGNOSIS — M5412 Radiculopathy, cervical region: Secondary | ICD-10-CM | POA: Diagnosis not present

## 2017-08-28 MED ORDER — DICLOFENAC SODIUM 75 MG PO TBEC: 75.0000 mg | DELAYED_RELEASE_TABLET | Freq: Two times a day (BID) | ORAL | 3 refills | Status: DC

## 2017-08-28 MED ORDER — CYCLOBENZAPRINE HCL 10 MG PO TABS: 5.0000 mg | ORAL_TABLET | Freq: Three times a day (TID) | ORAL | 0 refills | Status: DC | PRN

## 2017-08-28 NOTE — Progress Notes (Signed)
Dr. Frederico Hamman T. Oluwatamilore Starnes, MD, [facility] [place], [address] Phone: [phone] Fax: [phone]  08/28/2017  Patient: David Montes, MRN: [id number], DOB: 02-18-1961, 57 y.o.  Primary Physician:  Venia Carbon, MD   Chief Complaint  Patient presents with  . Shoulder Pain    Left-Lifted folding  up table out of trunk on Friday   Subjective:   David Montes is a 57 y.o. very pleasant male patient who presents with the following:  Pain in the posterior shoulder. Has some numbness and tingling and L that is there. No weakness.  He does not have a remote history of disc herniation which affected his right arm and neck, which was much worse than this, but similar in character.  He was lifting a table a folding table, and then developed some pain in his neck and his shoulder blade.  He also has some numbness and tingling in the distal extremity of the arm.  He has not felt any weakness.  Past Medical History, Surgical History, Social History, Family History, Problem List, Medications, and Allergies have been reviewed and updated if relevant.  Patient Active Problem List   Diagnosis Date Noted  . Preventative health care 07/03/2017  . 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 No pertinent past medical history.  Past Surgical History:  Procedure Laterality Date  . ANTERIOR CRUCIATE LIGAMENT REPAIR    . TONSILLECTOMY      Social History   Socioeconomic History  . Marital status: Married    Spouse name: Not on file  . Number of children: 2  . Years of education: Not on file  . Highest education level: Not on file  Social Needs  . Financial resource strain: Not on file  . Food insecurity - worry: Not on file  . Food insecurity - inability: Not on file  . Transportation needs - medical: Not on file  . Transportation needs - non-medical: Not on file  Occupational History  . Occupation: Outside Press photographer   Comment: Paint brushes  Tobacco Use  . Smoking status: Never Smoker  . Smokeless tobacco: Never Used  Substance and Sexual Activity  . Alcohol use: Yes    Comment: occ  . Drug use: No  . Sexual activity: Yes  Other Topics Concern  . Not on file  Social History Narrative       Family History  Problem Relation Age of Onset  . Graves' disease Mother   . Lung cancer Father        smoker  . Diabetes Maternal Grandmother   . Heart disease Paternal Grandmother        CHF    No Known Allergies  Medication list reviewed and updated in full in [facility].  GEN: No fevers, chills. Nontoxic. Primarily MSK c/o today. MSK: Detailed in the HPI GI: tolerating PO intake without difficulty Neuro: No numbness, parasthesias, or tingling associated. Otherwise the pertinent positives of the ROS are noted above.   Objective:   BP 118/70   Pulse 80   Temp 98.2 F (36.8 C) (Oral)   Ht 5' 7.5" (1.715 m)   Wt 168 lb (76.2 kg)   BMI 25.92 kg/m    GEN: Well-developed,well-nourished,in no acute distress; alert,appropriate and cooperative throughout examination HEENT: Normocephalic and atraumatic without obvious abnormalities. Ears, externally no deformities PULM: Breathing comfortably in no respiratory distress EXT: No clubbing, cyanosis, or edema PSYCH: Normally interactive. Cooperative during the  interview. Pleasant. Friendly and conversant. Not anxious or depressed appearing. Normal, full affect.  CERVICAL SPINE EXAM Range of motion: Flexion, extension, lateral bending, and rotation: Modest restriction, approximately 10-15% in all directions Pain with terminal motion: Mild. Spinous Processes: NT SCM: NT Upper paracervical muscles: Modest pain posteriorly. Upper traps: NT C5-T1 intact, sensation and motor intact, but subjective dullness in the hand  Radiology: No results found.  Assessment and Plan:   Cervical radiculopathy, acute   Relatively mild cervical  radiculopathy, patient was to start conservative, so begin with gentle range of motion, heat, scheduled NSAIDs for 2 3 weeks as well as some muscle relaxants at night.  If symptoms persist, then additional follow-up would be appropriate  Follow-up: No Follow-up on file.  Meds ordered this encounter  Medications  . diclofenac (VOLTAREN) 75 MG EC tablet    Sig: Take 1 tablet (75 mg total) by mouth 2 (two) times daily.    Dispense:  60 tablet    Refill:  3  . cyclobenzaprine (FLEXERIL) 10 MG tablet    Sig: Take 0.5-1 tablets (5-10 mg total) by mouth 3 (three) times daily as needed for muscle spasms.    Dispense:  30 tablet    Refill:  0    Signed,  Nicholson Starace T. Kember Boch, MD   Allergies as of 08/28/2017   No Known Allergies     Medication List        Accurate as of 08/28/17 11:59 PM. Always use your most recent med list.          cyclobenzaprine 10 MG tablet Commonly known as:  FLEXERIL Take 0.5-1 tablets (5-10 mg total) by mouth 3 (three) times daily as needed for muscle spasms.   diclofenac 75 MG EC tablet Commonly known as:  VOLTAREN Take 1 tablet (75 mg total) by mouth 2 (two) times daily.   ibuprofen 200 MG tablet Commonly known as:  ADVIL,MOTRIN Take 400 mg by mouth once.

## 2017-08-29 ENCOUNTER — Encounter: Payer: Self-pay | Admitting: Family Medicine

## 2017-09-07 ENCOUNTER — Encounter: Payer: Self-pay | Admitting: Internal Medicine

## 2017-09-11 ENCOUNTER — Encounter: Payer: Self-pay | Admitting: Internal Medicine

## 2017-11-27 ENCOUNTER — Ambulatory Visit (AMBULATORY_SURGERY_CENTER): Payer: Self-pay | Admitting: *Deleted

## 2017-11-27 ENCOUNTER — Other Ambulatory Visit: Payer: Self-pay

## 2017-11-27 VITALS — Ht 68.5 in | Wt 165.0 lb

## 2017-11-27 DIAGNOSIS — Z1211 Encounter for screening for malignant neoplasm of colon: Secondary | ICD-10-CM

## 2017-11-27 NOTE — Progress Notes (Signed)
Patient denies any allergies to eggs or soy. Patient denies any problems with anesthesia/sedation. Patient denies any oxygen use at home. Patient denies taking any diet/weight loss medications or blood thinners. EMMI education assisgned to patient on colonoscopy, this was explained and instructions given to patient. 

## 2017-12-12 ENCOUNTER — Encounter: Payer: BLUE CROSS/BLUE SHIELD | Admitting: Internal Medicine

## 2019-05-07 ENCOUNTER — Telehealth: Payer: Self-pay | Admitting: Internal Medicine

## 2019-05-07 DIAGNOSIS — Z1211 Encounter for screening for malignant neoplasm of colon: Secondary | ICD-10-CM
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
## 2019-05-08 NOTE — Telephone Encounter (Signed)
Please get him a stool kit sent in the mail and put in the order for me

## 2019-05-15 NOTE — Addendum Note (Signed)
Addended by: Pilar Grammes on: 05/15/2019 10:10 AM   Modules accepted: Orders

## 2019-05-15 NOTE — Telephone Encounter (Signed)
Spoke to wife. Kit placed up front for pickup. Future Order placed.

## 2019-06-05 ENCOUNTER — Other Ambulatory Visit: Payer: BLUE CROSS/BLUE SHIELD

## 2019-06-05 DIAGNOSIS — Z1211 Encounter for screening for malignant neoplasm of colon: Secondary | ICD-10-CM

## 2019-06-06 ENCOUNTER — Telehealth: Payer: Self-pay

## 2019-06-06 DIAGNOSIS — R195 Other fecal abnormalities: Secondary | ICD-10-CM

## 2019-06-06 LAB — FECAL OCCULT BLOOD, IMMUNOCHEMICAL: Fecal Occult Bld: POSITIVE — AB

## 2019-06-06 NOTE — Telephone Encounter (Signed)
[facility] called a critical result @ 1314  Positive IFOB

## 2019-06-06 NOTE — Telephone Encounter (Signed)
Please call him The fecal test showed signs of blood, so we need to proceed with a colonoscopy. Find out if he is okay with our group in [place].

## 2019-06-10 ENCOUNTER — Encounter: Payer: Self-pay | Admitting: Gastroenterology

## 2019-06-10 NOTE — Telephone Encounter (Signed)
Left message to call office

## 2019-06-10 NOTE — Telephone Encounter (Signed)
Spoke to pt. He said [facility] was fine with him. I advised him that Dr Silvio Pate will do the referral and he will hear from someone in the next few days. With the holiday I suggested he wait until next Wednesday before calling to f/u if he did not hear from anyone.

## 2019-06-10 NOTE — Addendum Note (Signed)
Addended by: Viviana Simpler I on: 06/10/2019 04:52 PM   Modules accepted: Orders

## 2019-06-25 ENCOUNTER — Other Ambulatory Visit: Payer: Self-pay

## 2019-06-25 ENCOUNTER — Encounter: Payer: Self-pay | Admitting: Gastroenterology

## 2019-06-25 ENCOUNTER — Ambulatory Visit (AMBULATORY_SURGERY_CENTER): Payer: Self-pay

## 2019-06-25 VITALS — Temp 97.1°F | Ht 68.5 in | Wt 167.0 lb

## 2019-06-25 DIAGNOSIS — Z1211 Encounter for screening for malignant neoplasm of colon: Secondary | ICD-10-CM

## 2019-06-25 MED ORDER — NA SULFATE-K SULFATE-MG SULF 17.5-3.13-1.6 GM/177ML PO SOLN: 1.0000 | Freq: Once | ORAL | 0 refills | Status: AC

## 2019-06-25 NOTE — Progress Notes (Signed)
Denies allergies to eggs or soy products. Denies complication of anesthesia or sedation. Denies use of weight loss medication. Denies use of O2.   Emmi instructions given for colonoscopy.  Covid screening is scheduled for 07/04/19 @ 3:20 Pm. A 15.00 coupon for Suprep was given to the patient.

## 2019-07-04 ENCOUNTER — Ambulatory Visit (INDEPENDENT_AMBULATORY_CARE_PROVIDER_SITE_OTHER): Payer: BC Managed Care – PPO

## 2019-07-04 ENCOUNTER — Other Ambulatory Visit: Payer: Self-pay | Admitting: Gastroenterology

## 2019-07-04 DIAGNOSIS — Z1159 Encounter for screening for other viral diseases: Secondary | ICD-10-CM

## 2019-07-05 LAB — SARS CORONAVIRUS 2 (TAT 6-24 HRS): SARS Coronavirus 2: NEGATIVE

## 2019-07-08 ENCOUNTER — Telehealth: Payer: Self-pay | Admitting: Gastroenterology

## 2019-07-08 NOTE — Telephone Encounter (Signed)
Pt started bowel prep for colonoscopy tomorrow and after completing first dose of Suprep he noted epigastric pain and then vomited a very large amount of clear fluids. He has been having intermittent reflux symptoms and epigastric pain for a couple months. Taking Nexium on and off.  Advised to stop the bowel prep, cancel colonoscopy for tomorrow, continue clear liquids tonight then advance diet tomorrow as tolerated, resume Nexium 20 mg every day until office appt, call office tomorrow to schedule an office appt for evaluation. Might need EGD to further evaluate.

## 2019-07-08 NOTE — Telephone Encounter (Signed)
Pt and wife on call. His N/V resolved just after their call earlier tonight. He has had multiple bowel movements since the earlier call. He feels much better without epigastric pain and wants to proceed with colonoscopy as scheduled tomorrow.  Advised to drink cool but not ice cold water following second dose of Suprep. If second dose is tolerated, completed and bowels clearing will proceed with colonoscopy tomorrow as scheduled. Advised to continue Nexium 20 mg daily for 1 month.

## 2019-07-09 ENCOUNTER — Encounter: Payer: Self-pay | Admitting: Gastroenterology

## 2019-07-09 ENCOUNTER — Other Ambulatory Visit: Payer: Self-pay

## 2019-07-09 ENCOUNTER — Ambulatory Visit (AMBULATORY_SURGERY_CENTER): Payer: BC Managed Care – PPO | Admitting: Gastroenterology

## 2019-07-09 VITALS — BP 91/62 | HR 74 | Temp 98.0°F | Resp 13 | Ht 68.5 in | Wt 167.0 lb

## 2019-07-09 DIAGNOSIS — Z1211 Encounter for screening for malignant neoplasm of colon: Secondary | ICD-10-CM | POA: Diagnosis not present

## 2019-07-09 DIAGNOSIS — K635 Polyp of colon: Secondary | ICD-10-CM | POA: Diagnosis not present

## 2019-07-09 DIAGNOSIS — D124 Benign neoplasm of descending colon: Secondary | ICD-10-CM

## 2019-07-09 DIAGNOSIS — D120 Benign neoplasm of cecum: Secondary | ICD-10-CM

## 2019-07-09 DIAGNOSIS — R195 Other fecal abnormalities: Secondary | ICD-10-CM | POA: Diagnosis not present

## 2019-07-09 DIAGNOSIS — D125 Benign neoplasm of sigmoid colon: Secondary | ICD-10-CM

## 2019-07-09 MED ORDER — SODIUM CHLORIDE 0.9 % IV SOLN: 500.0000 mL | Freq: Once | INTRAVENOUS | Status: DC

## 2019-07-09 NOTE — Progress Notes (Signed)
Called to room to assist during endoscopic procedure.  Patient ID and intended procedure confirmed with present staff. Received instructions for my participation in the procedure from the performing physician.  

## 2019-07-09 NOTE — Op Note (Signed)
[place] Patient Name: David Montes Procedure Date: 07/09/2019 11:59 AM MRN: [id number] Endoscopist: Ladene Artist , MD Age: 58 Referring MD:  Date of Birth: 01-17-1961 Gender: Male Account #: [id number] Procedure:                Colonoscopy Indications:              Screening for colorectal malignant neoplasm Medicines:                Monitored Anesthesia Care Procedure:                Pre-Anesthesia Assessment:                           - Prior to the procedure, a History and Physical                            was performed, and patient medications and                            allergies were reviewed. The patient's tolerance of                            previous anesthesia was also reviewed. The risks                            and benefits of the procedure and the sedation                            options and risks were discussed with the patient.                            All questions were answered, and informed consent                            was obtained. Prior Anticoagulants: The patient has                            taken no previous anticoagulant or antiplatelet                            agents. ASA Grade Assessment: II - A patient with                            mild systemic disease. After reviewing the risks                            and benefits, the patient was deemed in                            satisfactory condition to undergo the procedure.                           After obtaining informed consent, the colonoscope  was passed under direct vision. Throughout the                            procedure, the patient's blood pressure, pulse, and                            oxygen saturations were monitored continuously. The                            Colonoscope was introduced through the anus and                            advanced to the the cecum, identified by                            appendiceal orifice and  ileocecal valve. The                            ileocecal valve, appendiceal orifice, and rectum                            were photographed. The quality of the bowel                            preparation was good. The colonoscopy was performed                            without difficulty. The patient tolerated the                            procedure well. Scope In: 12:13:14 PM Scope Out: 12:28:00 PM Scope Withdrawal Time: 0 hours 13 minutes 30 seconds  Total Procedure Duration: 0 hours 14 minutes 46 seconds  Findings:                 The perianal and digital rectal examinations were                            normal.                           A 12 mm polyp was found in the sigmoid colon. The                            polyp was pedunculated with a thick stalk. The                            polyp was removed with a hot snare. Resection and                            retrieval were complete.                           Two sessile polyps were found in the descending  colon and cecum. The polyps were 5 to 7 mm in size.                            These polyps were removed with a cold snare.                            Resection and retrieval were complete.                           Many small-mouthed diverticula were found in the                            left colon. There was narrowing of the colon in                            association with the diverticular opening. There                            was evidence of an impacted diverticulum. There was                            no evidence of diverticular bleeding.                           Internal hemorrhoids were found during                            retroflexion. The hemorrhoids were small and Grade                            I (internal hemorrhoids that do not prolapse).                           The exam was otherwise without abnormality on                            direct and retroflexion  views. Complications:            No immediate complications. Estimated blood loss:                            None. Estimated Blood Loss:     Estimated blood loss: none. Impression:               - One 12 mm polyp in the sigmoid colon, removed                            with a hot snare. Resected and retrieved.                           - Two 5 to 7 mm polyps in the descending colon and                            in the cecum, removed with a cold  snare. Resected                            and retrieved.                           - Mild diverticulosis in the left colon.                           - Internal hemorrhoids.                           - The examination was otherwise normal on direct                            and retroflexion views. Recommendation:           - Repeat colonoscopy after studies are complete for                            surveillance based on pathology results.                           - Patient has a contact number available for                            emergencies. The signs and symptoms of potential                            delayed complications were discussed with the                            patient. Return to normal activities tomorrow.                            Written discharge instructions were provided to the                            patient.                           - High fiber diet.                           - Continue present medications.                           - Await pathology results.                           - No aspirin, ibuprofen, naproxen, or other                            non-steroidal anti-inflammatory drugs for 2 weeks                            after polyp removal. Ladene Artist, MD 07/09/2019 12:32:10 PM This report has  been signed electronically.

## 2019-07-09 NOTE — Progress Notes (Signed)
Temperature taken by J.B., VS taken by D.T. 

## 2019-07-09 NOTE — Progress Notes (Signed)
PT taken to PACU. Monitors in place. VSS. Report given to RN. 

## 2019-07-09 NOTE — Progress Notes (Signed)
Pt's states no medical or surgical changes since previsit or office visit. 

## 2019-07-09 NOTE — Patient Instructions (Signed)
HANDOUTS PROVIDED ON: HIGH FIBER DIET, POLYPS, DIVERTICULOSIS, & HEMORRHOIDS  The polyps removed today have been sent for pathology.  The results can take 1-3 weeks to receive.  When your next colonoscopy should occur will be based on the pathology results.    You may resume your previous diet and medication schedule.  No aspirin, ibuprofen, naproxen, or other NSAIDs for 2 weeks due to polyp removal.  Thank you for allowing [place] to care for you today!!!  YOU HAD AN ENDOSCOPIC PROCEDURE TODAY AT [facility]:   Refer to the procedure report that was given to you for any specific questions about what was found during the examination.  If the procedure report does not answer your questions, please call your gastroenterologist to clarify.  If you requested that your care partner not be given the details of your procedure findings, then the procedure report has been included in a sealed envelope for you to review at your convenience later.  YOU SHOULD EXPECT: Some feelings of bloating in the abdomen. Passage of more gas than usual.  Walking can help get rid of the air that was put into your GI tract during the procedure and reduce the bloating. If you had a lower endoscopy (such as a colonoscopy or flexible sigmoidoscopy) you may notice spotting of blood in your stool or on the toilet paper. If you underwent a bowel prep for your procedure, you may not have a normal bowel movement for a few days.  Please Note:  You might notice some irritation and congestion in your nose or some drainage.  This is from the oxygen used during your procedure.  There is no need for concern and it should clear up in a day or so.  SYMPTOMS TO REPORT IMMEDIATELY:   Following lower endoscopy (colonoscopy or flexible sigmoidoscopy):  Excessive amounts of blood in the stool  Significant tenderness or worsening of abdominal pains  Swelling of the abdomen that is new, acute  Fever of 100F or higher  For urgent  or emergent issues, a gastroenterologist can be reached at any hour by calling [phone].   DIET:  We do recommend a small meal at first, but then you may proceed to your regular diet.  Drink plenty of fluids but you should avoid alcoholic beverages for 24 hours.  ACTIVITY:  You should plan to take it easy for the rest of today and you should NOT DRIVE or use heavy machinery until tomorrow (because of the sedation medicines used during the test).    FOLLOW UP: Our staff will call the number listed on your records 48-72 hours following your procedure to check on you and address any questions or concerns that you may have regarding the information given to you following your procedure. If we do not reach you, we will leave a message.  We will attempt to reach you two times.  During this call, we will ask if you have developed any symptoms of COVID 19. If you develop any symptoms (ie: fever, flu-like symptoms, shortness of breath, cough etc.) before then, please call [phone].  If you test positive for Covid 19 in the 2 weeks post procedure, please call and report this information to [place].    If any biopsies were taken you will be contacted by phone or by letter within the next 1-3 weeks.  Please call us at [phone] if you have not heard about the biopsies in 3 weeks.    SIGNATURES/CONFIDENTIALITY: You and/or  your care partner have signed paperwork which will be entered into your electronic medical record.  These signatures attest to the fact that that the information above on your After Visit Summary has been reviewed and is understood.  Full responsibility of the confidentiality of this discharge information lies with you and/or your care-partner.

## 2019-07-11 ENCOUNTER — Telehealth: Payer: Self-pay | Admitting: *Deleted

## 2019-07-11 NOTE — Telephone Encounter (Signed)
  Follow up Call-  Call back number 07/09/2019  Post procedure Call Back phone  # [phone]  Permission to leave phone message Yes  Some recent data might be hidden     Patient questions:  Do you have a fever, pain , or abdominal swelling? No. Pain Score  0 *  Have you tolerated food without any problems? Yes.    Have you been able to return to your normal activities? Yes.    Do you have any questions about your discharge instructions: Diet   No. Medications  No. Follow up visit  No.  Do you have questions or concerns about your Care? No.  Actions: * If pain score is 4 or above: No action needed, pain <4.  1. Have you developed a fever since your procedure? No   2.   Have you had an respiratory symptoms (SOB or cough) since your procedure? no  3.   Have you tested positive for COVID 19 since your procedure no  4.   Have you had any family members/close contacts diagnosed with the COVID 19 since your procedure?  no   If yes to any of these questions please route to Joylene John, RN and Alphonsa Gin, Therapist, sports.

## 2019-07-20 ENCOUNTER — Encounter: Payer: Self-pay | Admitting: Gastroenterology

## 2019-10-17 ENCOUNTER — Ambulatory Visit
Admission: EM | Admit: 2019-10-17 | Discharge: 2019-10-17 | Disposition: A | Discharge status: Home / Self Care | Payer: No Typology Code available for payment source | Attending: Emergency Medicine | Admitting: Emergency Medicine

## 2019-10-17 ENCOUNTER — Encounter: Payer: Self-pay | Admitting: Emergency Medicine

## 2019-10-17 ENCOUNTER — Other Ambulatory Visit: Payer: Self-pay

## 2019-10-17 DIAGNOSIS — B029 Zoster without complications: Secondary | ICD-10-CM

## 2019-10-17 MED ORDER — VALACYCLOVIR HCL 1 G PO TABS: 1000.0000 mg | ORAL_TABLET | Freq: Three times a day (TID) | ORAL | 0 refills | Status: DC

## 2019-10-17 NOTE — ED Triage Notes (Signed)
Patient in office due to questionable shingles on lower back right side.  Started a couple a days ago Had a headache but took Excedrin   Denies:fever, nausea and vomiting

## 2019-10-17 NOTE — Discharge Instructions (Signed)
Take the Valtrex as directed.    Follow up with your primary care provider as needed.

## 2019-10-17 NOTE — ED Provider Notes (Signed)
Roderic Pal[facility]    CSN: [id number] Arrival date & time: 10/17/19  1300      History   Chief Complaint Chief Complaint  Patient presents with  . Possible shingles    HPI David Montes is a 59 y.o. male.   Patient presents with a painful rash on his right lower back and right groin x2 days.  No drainage.  He denies fever, chills, sore throat, cough, abdominal pain, dysuria, penile symptoms, testicular pain, back pain, or other symptoms.  He attempted treatment at home with hydrocortisone cream.  The history is provided by the patient.    Past Medical History:  Diagnosis Date  . Allergy     Patient Active Problem List   Diagnosis Date Noted  . Preventative health care 07/03/2017  . 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 ANTERIOR CRUCIATE LIGAMENT REPAIR    . TONSILLECTOMY         Home Medications    Prior to Admission medications   Medication Sig Start Date End Date Taking? Authorizing Provider  aspirin-acetaminophen-caffeine (EXCEDRIN MIGRAINE) [phone] MG tablet Take 1 tablet by mouth every 6 (six) hours as needed for headache.    [provider]  naproxen sodium (ALEVE) 220 MG tablet Take 220 mg by mouth as needed.    [provider]  valACYclovir (VALTREX) 1000 MG tablet Take 1 tablet (1,000 mg total) by mouth 3 (three) times daily. 10/17/19   Sharion Balloon, NP    Family History Family History  Problem Relation Age of Onset  . Graves' disease Mother   . Lung cancer Father        smoker  . Diabetes Maternal Grandmother   . Heart disease Paternal Grandmother        CHF  . Colon cancer Neg Hx   . Esophageal cancer Neg Hx   . Stomach cancer Neg Hx   . Rectal cancer Neg Hx     Social History Social History   Tobacco Use  . Smoking status: Never Smoker  . Smokeless tobacco: Never Used  Substance Use Topics  . Alcohol use: Yes    Alcohol/week: 1.0 standard drinks    Types: 1 Standard  drinks or equivalent per week  . Drug use: No     Allergies   Patient has no known allergies.   Review of Systems Review of Systems  Constitutional: Negative for chills and fever.  HENT: Negative for ear pain and sore throat.   Eyes: Negative for pain and visual disturbance.  Respiratory: Negative for cough and shortness of breath.   Cardiovascular: Negative for chest pain and palpitations.  Gastrointestinal: Negative for abdominal pain and vomiting.  Genitourinary: Negative for dysuria and hematuria.  Musculoskeletal: Negative for arthralgias and back pain.  Skin: Positive for rash. Negative for color change.  Neurological: Negative for seizures and syncope.  All other systems reviewed and are negative.    Physical Exam Triage Vital Signs ED Triage Vitals [10/17/19 1303]  Enc Vitals Group     BP 127/83     Pulse Rate 92     Resp 18     Temp 98.4 F (36.9 C)     Temp Source Oral     SpO2 97 %     Weight      Height      Head Circumference      Peak Flow      Pain Score  Pain Loc      Pain Edu?      Excl. in [place]?    No data found.  Updated Vital Signs BP 127/83 (BP Location: Left Arm)   Pulse 91   Temp 98.4 F (36.9 C) (Oral)   Resp 18   Wt 165 lb (74.8 kg)   SpO2 97%   BMI 24.72 kg/m   Visual Acuity Right Eye Distance:   Left Eye Distance:   Bilateral Distance:    Right Eye Near:   Left Eye Near:    Bilateral Near:     Physical Exam Vitals and nursing note reviewed.  Constitutional:      General: He is not in acute distress.    Appearance: He is well-developed. He is not ill-appearing.  HENT:     Head: Normocephalic and atraumatic.     Mouth/Throat:     Mouth: Mucous membranes are moist.     Pharynx: Oropharynx is clear.  Eyes:     Conjunctiva/sclera: Conjunctivae normal.  Cardiovascular:     Rate and Rhythm: Normal rate and regular rhythm.     Heart sounds: No murmur.  Pulmonary:     Effort: Pulmonary effort is normal. No  respiratory distress.     Breath sounds: Normal breath sounds.  Abdominal:     Palpations: Abdomen is soft.     Tenderness: There is no abdominal tenderness. There is no guarding or rebound.  Musculoskeletal:       Arms:     Cervical back: Neck supple.  Skin:    General: Skin is warm and dry.     Findings: Rash present.     Comments: 4 cm x 3 cm vesicular cluster on right lower back and 2 cm x 1 cm vesicular cluster on right groin.  Localized erythema; no drainage; mildly tender to palpation.   Neurological:     General: No focal deficit present.     Mental Status: He is alert and oriented to person, place, and time.     Sensory: No sensory deficit.     Motor: No weakness.     Gait: Gait normal.  Psychiatric:        Mood and Affect: Mood normal.        Behavior: Behavior normal.      UC Treatments / Results  Labs (all labs ordered are listed, but only abnormal results are displayed) Labs Reviewed - No data to display  EKG   Radiology No results found.  Procedures Procedures (including critical care time)  Medications Ordered in UC Medications - No data to display  Initial Impression / Assessment and Plan / UC Course  I have reviewed the triage vital signs and the nursing notes.  Pertinent labs & imaging results that were available during my care of the patient were reviewed by me and considered in my medical decision making (see chart for details).   Shingles.  Treating with Valtrex.  Instructed patient to monitor for secondary skin infection.  Discussed pain management.  Instructed him to follow-up with his PCP as needed.  Patient agrees to plan of care.     Final Clinical Impressions(s) / UC Diagnoses   Final diagnoses:  Herpes zoster without complication     Discharge Instructions     Take the Valtrex as directed.    Follow up with your primary care provider as needed.        ED Prescriptions    Medication Sig Dispense Auth. Provider  valACYclovir (VALTREX) 1000 MG tablet Take 1 tablet (1,000 mg total) by mouth 3 (three) times daily. 21 tablet Sharion Balloon, NP     PDMP not reviewed this encounter.   Sharion Balloon, NP 10/17/19 1341

## 2019-10-18 ENCOUNTER — Telehealth: Payer: Self-pay | Admitting: Emergency Medicine

## 2019-10-18 MED ORDER — HYDROCODONE-ACETAMINOPHEN 5-325 MG PO TABS: 2.0000 | ORAL_TABLET | ORAL | 0 refills | Status: DC | PRN

## 2019-10-18 NOTE — Addendum Note (Signed)
Addended by: Barkley Boards on: 10/18/2019 02:07 PM   Modules accepted: Orders

## 2019-10-18 NOTE — Addendum Note (Signed)
Addended by: Barkley Boards on: 10/18/2019 01:35 PM   Modules accepted: Orders

## 2019-10-18 NOTE — Telephone Encounter (Signed)
Patient requests change in pharmacy for [place] order.  Order to [facility] canceled and new order sent to [facility].

## 2019-10-18 NOTE — Telephone Encounter (Signed)
Pt called stating that he was seen yesterday for shingles and did not feel like he needed anything for pain at that time. He states he could call back if he changed his mind. He called wanting something called in for pain, that he had a lot more pain while trying to sleep last night.   [place]   Call back # [phone]

## 2019-10-18 NOTE — Telephone Encounter (Signed)
Called [facility] and cancelled Norco rx.

## 2019-10-18 NOTE — Telephone Encounter (Signed)
Patient called Urgent Care.  Discussed with [facility] that he is having pain at night due to Shingles.  Treating with Norco; dispense 6 tabs.

## 2020-10-23 DIAGNOSIS — Z20822 Contact with and (suspected) exposure to covid-19: Secondary | ICD-10-CM | POA: Diagnosis not present

## 2021-04-22 DIAGNOSIS — Z20822 Contact with and (suspected) exposure to covid-19: Secondary | ICD-10-CM | POA: Diagnosis not present

## 2021-07-02 ENCOUNTER — Encounter: Payer: Self-pay | Admitting: Family Medicine

## 2021-07-02 ENCOUNTER — Telehealth (INDEPENDENT_AMBULATORY_CARE_PROVIDER_SITE_OTHER): Payer: No Typology Code available for payment source | Admitting: Family Medicine

## 2021-07-02 DIAGNOSIS — U071 COVID-19: Secondary | ICD-10-CM | POA: Insufficient documentation

## 2021-07-02 MED ORDER — MOLNUPIRAVIR EUA 200MG CAPSULE: 4.0000 | ORAL_CAPSULE | Freq: Two times a day (BID) | ORAL | 0 refills | Status: AC

## 2021-07-02 NOTE — Patient Instructions (Signed)
Drink fluids and rest  mucinex DM is good for cough and congestion  Nasal saline for congestion as needed  Tylenol for fever or pain or headache , also ibuprofen Please alert [place] if symptoms worsen (if severe or short of breath please go to the ER)   Take the molnupiravir as directed and alert [place] if any intolerable side effects   Isolate for a minimum of 5 days (longer if still symptomatic) and mask for 10 or more days after that

## 2021-07-02 NOTE — Assessment & Plan Note (Signed)
Symptoms are relatively mild in 60 yo  Based on age tx with molnupiravir (after discussion of pros and cons and potential side effects)  Discussed symptomatic care  Fluids/rest and isolation 5 days minimum followed by masking for 10 d  inst to update if worse/new symptoms  ER precautions discussed Update if not starting to improve in a week or if worsening

## 2021-07-02 NOTE — Progress Notes (Signed)
Virtual Visit via Video Note  I connected with David Montes on 07/02/21 at  9:00 AM EST by a video enabled telemedicine application and verified that I am speaking with the correct person using two identifiers.  Location: Patient: home Provider: office   I discussed the limitations of evaluation and management by telemedicine and the availability of in person appointments. The patient expressed understanding and agreed to proceed.  Parties involved in encounter  Patient: David Montes    Provider:  Loura Pardon MD    Video failed today and visit was done by phone  History of Present Illness: Pt presents with c/o covid 10  Was out of town and then returned feeling bad  Yesterday   Covid pos test last night   Body aches-has not checked temp  Some sweats this am  Cough -pretty mild /dry  No wheeze or chest tightness  Headache -above eyes yesterday Fatigue -wiped out   No nasal congestion   Minor ST   No n/v/d    Otc: motrin and excedrin migraine  Mucinex DM   He is covid immunized but no booster    Patient Active Problem List   Diagnosis Date Noted   COVID-19 07/02/2021   Preventative health care 07/03/2017   Dysgeusia 07/03/2017   Past Medical History:  Diagnosis Date   Allergy    Past Surgical History:  Procedure Laterality Date   ANTERIOR CRUCIATE LIGAMENT REPAIR     TONSILLECTOMY     Social History   Tobacco Use   Smoking status: Never   Smokeless tobacco: Never  Vaping Use   Vaping Use: Never used  Substance Use Topics   Alcohol use: Yes    Alcohol/week: 1.0 standard drink    Types: 1 Standard drinks or equivalent per week   Drug use: No   Family History  Problem Relation Age of Onset   Graves' disease Mother    Lung cancer Father        smoker   Diabetes Maternal Grandmother    Heart disease Paternal Grandmother        CHF   Colon cancer Neg Hx    Esophageal cancer Neg Hx    Stomach cancer Neg Hx    Rectal cancer Neg Hx    No  Known Allergies Current Outpatient Medications on File Prior to Visit  Medication Sig Dispense Refill   aspirin-acetaminophen-caffeine (EXCEDRIN MIGRAINE) 250-250-65 MG tablet Take 1 tablet by mouth every 6 (six) hours as needed for headache.     Dextromethorphan-guaiFENesin (MUCINEX FAST-MAX DM MAX PO) Take 20 mLs by mouth as needed.     naproxen sodium (ALEVE) 220 MG tablet Take 220 mg by mouth as needed.     Current Facility-Administered Medications on File Prior to Visit  Medication Dose Route Frequency Provider Last Rate Last Admin   0.9 %  sodium chloride infusion  500 mL Intravenous Once Ladene Artist, MD       Review of Systems  Constitutional:  Positive for malaise/fatigue. Negative for chills and fever.       Body aches   HENT:  Negative for congestion, ear pain, sinus pain and sore throat.   Eyes:  Negative for blurred vision, discharge and redness.  Respiratory:  Positive for cough. Negative for sputum production, shortness of breath, wheezing and stridor.   Cardiovascular:  Negative for chest pain, palpitations and leg swelling.  Gastrointestinal:  Negative for abdominal pain, diarrhea, nausea and vomiting.  Musculoskeletal:  Negative for myalgias.  Skin:  Negative for rash.  Neurological:  Positive for headaches. Negative for dizziness.   Observations/Objective: Pt sounds well, in no distress  Mildly hoarse Occ throat clearing  No cough or wheeze noted with speech Good historian, nl cognition  Nl mood   Assessment and Plan: Problem List Items Addressed This Visit       Other   COVID-19    Symptoms are relatively mild in 60 yo  Based on age tx with molnupiravir (after discussion of pros and cons and potential side effects)  Discussed symptomatic care  Fluids/rest and isolation 5 days minimum followed by masking for 10 d  inst to update if worse/new symptoms  ER precautions discussed Update if not starting to improve in a week or if worsening        Relevant  Medications   molnupiravir EUA (LAGEVRIO) 200 mg CAPS capsule     Follow Up Instructions: Drink fluids and rest  mucinex DM is good for cough and congestion  Nasal saline for congestion as needed  Tylenol for fever or pain or headache , also ibuprofen Please alert [place] if symptoms worsen (if severe or short of breath please go to the ER)   Take the molnupiravir as directed and alert [place] if any intolerable side effects   Isolate for a minimum of 5 days (longer if still symptomatic) and mask for 10 or more days after that    I discussed the assessment and treatment plan with the patient. The patient was provided an opportunity to ask questions and all were answered. The patient agreed with the plan and demonstrated an understanding of the instructions.   The patient was advised to call back or seek an in-person evaluation if the symptoms worsen or if the condition fails to improve as anticipated.  I provided 16 minutes of non-face-to-face time during this encounter.   Loura Pardon, MD

## 2021-12-10 ENCOUNTER — Encounter: Payer: Self-pay | Admitting: Internal Medicine

## 2021-12-10 ENCOUNTER — Ambulatory Visit (INDEPENDENT_AMBULATORY_CARE_PROVIDER_SITE_OTHER): Payer: BC Managed Care – PPO | Admitting: Internal Medicine

## 2021-12-10 VITALS — BP 106/68 | HR 72 | Temp 97.6°F | Ht 67.25 in | Wt 158.0 lb

## 2021-12-10 DIAGNOSIS — N401 Enlarged prostate with lower urinary tract symptoms: Secondary | ICD-10-CM

## 2021-12-10 DIAGNOSIS — Z125 Encounter for screening for malignant neoplasm of prostate: Secondary | ICD-10-CM

## 2021-12-10 DIAGNOSIS — Z0000 Encounter for general adult medical examination without abnormal findings: Secondary | ICD-10-CM | POA: Diagnosis not present

## 2021-12-10 DIAGNOSIS — H9311 Tinnitus, right ear: Secondary | ICD-10-CM | POA: Diagnosis not present

## 2021-12-10 DIAGNOSIS — H9319 Tinnitus, unspecified ear: Secondary | ICD-10-CM | POA: Insufficient documentation

## 2021-12-10 DIAGNOSIS — N400 Enlarged prostate without lower urinary tract symptoms: Secondary | ICD-10-CM | POA: Insufficient documentation

## 2021-12-10 LAB — CBC
HCT: 45.9 % (ref 39.0–52.0)
Hemoglobin: 15.4 g/dL (ref 13.0–17.0)
MCHC: 33.5 g/dL (ref 30.0–36.0)
MCV: 92.8 fl (ref 78.0–100.0)
Platelets: 198 10*3/uL (ref 150.0–400.0)
RBC: 4.95 Mil/uL (ref 4.22–5.81)
RDW: 12.9 % (ref 11.5–15.5)
WBC: 4.5 10*3/uL (ref 4.0–10.5)

## 2021-12-10 LAB — COMPREHENSIVE METABOLIC PANEL
ALT: 23 U/L (ref 0–53)
AST: 19 U/L (ref 0–37)
Albumin: 4.3 g/dL (ref 3.5–5.2)
Alkaline Phosphatase: 61 U/L (ref 39–117)
BUN: 21 mg/dL (ref 6–23)
CO2: 30 mEq/L (ref 19–32)
Calcium: 9.4 mg/dL (ref 8.4–10.5)
Chloride: 106 mEq/L (ref 96–112)
Creatinine, Ser: 1.09 mg/dL (ref 0.40–1.50)
GFR: 74.65 mL/min (ref >60.00)
Glucose, Bld: 104 mg/dL — ABNORMAL HIGH (ref 70–99)
Potassium: 4.4 mEq/L (ref 3.5–5.1)
Sodium: 141 mEq/L (ref 135–145)
Total Bilirubin: 0.6 mg/dL (ref 0.2–1.2)
Total Protein: 6.4 g/dL (ref 6.0–8.3)

## 2021-12-10 LAB — PSA: PSA: 1.62 ng/mL (ref 0.10–4.00)

## 2021-12-10 NOTE — Assessment & Plan Note (Signed)
Mild symptoms now He is considering saw palmetto Consider tamsulosin if worse

## 2021-12-10 NOTE — Progress Notes (Signed)
Subjective:    Patient ID: David Montes, male    DOB: [birth year], 61 y.o.   MRN: [id number]  HPI Here for physical  Came home from trip and felt bad Positive COVID-- in December Since then, has sound in right ear No clear change in hearing---may have some problems with competing sounds Not sick--but has sense of sloshing  Slipped using pole saw in yard 2+ years ago Has bulge on inside of left knee since then No weakness or pain  Told he has enlarged prostate in the past Some urgency ---especially after coffee Occasional nocturia Flow is slightly slower No sig sexual issues  Current Outpatient Medications on File Prior to Visit  Medication Sig Dispense Refill   aspirin-acetaminophen-caffeine (EXCEDRIN MIGRAINE) 250-250-65 MG tablet Take 1 tablet by mouth every 6 (six) hours as needed for headache.     naproxen sodium (ALEVE) 220 MG tablet Take 220 mg by mouth as needed.     No current facility-administered medications on file prior to visit.    No Known Allergies  Past Medical History:  Diagnosis Date   Allergy     Past Surgical History:  Procedure Laterality Date   ANTERIOR CRUCIATE LIGAMENT REPAIR     TONSILLECTOMY      Family History  Problem Relation Age of Onset   Graves' disease Mother    Lung cancer Father        smoker   Diabetes Maternal Grandmother    Heart disease Paternal Grandmother        CHF   Colon cancer Neg Hx    Esophageal cancer Neg Hx    Stomach cancer Neg Hx    Rectal cancer Neg Hx     Social History   Socioeconomic History   Marital status: Married    Spouse name: Not on file   Number of children: 2   Years of education: Not on file   Highest education level: Not on file  Occupational History   Occupation: Outside Press photographer    Comment: Paint brushes  Tobacco Use   Smoking status: Never    Passive exposure: Never   Smokeless tobacco: Never  Vaping Use   Vaping Use: Never used  Substance and Sexual Activity   Alcohol use:  Yes    Alcohol/week: 1.0 standard drink    Types: 1 Standard drinks or equivalent per week   Drug use: No   Sexual activity: Yes  Other Topics Concern   Not on file  Social History Narrative      Social Determinants of Health   Financial Resource Strain: Not on file  Food Insecurity: Not on file  Transportation Needs: Not on file  Physical Activity: Not on file  Stress: Not on file  Social Connections: Not on file  Intimate Partner Violence: Not on file   Review of Systems  Constitutional:  Negative for fatigue and unexpected weight change.       Walks regularly Wears seat belt  HENT:  Positive for tinnitus. Negative for dental problem and hearing loss.        Regular dental visits  Eyes:  Negative for visual disturbance.       No diplopia or unilateral vision loss  Respiratory:  Negative for cough, chest tightness and shortness of breath.   Cardiovascular:  Negative for chest pain, palpitations and leg swelling.  Gastrointestinal:  Negative for blood in stool and constipation.       Occ heartburn---nexium OTC prn  Endocrine: Negative for polydipsia  and polyuria.  Genitourinary:  Positive for difficulty urinating and urgency.  Musculoskeletal:  Negative for arthralgias and joint swelling.       Occ back pain  Skin:  Negative for rash.       No suspicious skin lesions  Allergic/Immunologic: Positive for environmental allergies. Negative for immunocompromised state.       Worse this year---used OTC  Neurological:  Negative for dizziness, syncope, light-headedness and headaches.  Hematological:  Negative for adenopathy. Does not bruise/bleed easily.  Psychiatric/Behavioral:  Negative for dysphoric mood and sleep disturbance. The patient is not nervous/anxious.       Objective:   Physical Exam Constitutional:      Appearance: Normal appearance.  HENT:     Right Ear: Tympanic membrane and ear canal normal.     Left Ear: Tympanic membrane and ear canal normal.      Mouth/Throat:     Pharynx: No oropharyngeal exudate or posterior oropharyngeal erythema.  Eyes:     Conjunctiva/sclera: Conjunctivae normal.     Pupils: Pupils are equal, round, and reactive to light.  Cardiovascular:     Rate and Rhythm: Normal rate and regular rhythm.     Pulses: Normal pulses.     Heart sounds: No murmur heard.   No gallop.  Pulmonary:     Effort: Pulmonary effort is normal.     Breath sounds: Normal breath sounds. No wheezing or rales.  Abdominal:     Palpations: Abdomen is soft.     Tenderness: There is no abdominal tenderness.  Musculoskeletal:     Cervical back: Neck supple.     Right lower leg: No edema.     Left lower leg: No edema.     Comments: Has soft tissue prominence along medial left knee---normal ROM and knee stability  Lymphadenopathy:     Cervical: No cervical adenopathy.  Skin:    Findings: No lesion or rash.  Neurological:     General: No focal deficit present.     Mental Status: He is alert and oriented to person, place, and time.  Psychiatric:        Mood and Affect: Mood normal.        Behavior: Behavior normal.           Assessment & Plan:

## 2021-12-10 NOTE — Assessment & Plan Note (Signed)
Healthy Discussed exercise Prefers no flu vaccines (son may have had reaction), COVID boosters or shingrix Colon due 2025 Discussed PSA--will check

## 2021-12-10 NOTE — Assessment & Plan Note (Signed)
Since COVID Needs audiology eval if hearing worsens

## 2023-09-11 ENCOUNTER — Encounter: Payer: Self-pay | Admitting: Internal Medicine

## 2023-09-11 ENCOUNTER — Ambulatory Visit (INDEPENDENT_AMBULATORY_CARE_PROVIDER_SITE_OTHER): Payer: No Typology Code available for payment source | Admitting: Internal Medicine

## 2023-09-11 VITALS — BP 108/64 | HR 97 | Temp 98.3°F | Ht 67.0 in | Wt 157.0 lb

## 2023-09-11 DIAGNOSIS — Z23 Encounter for immunization: Secondary | ICD-10-CM | POA: Diagnosis not present

## 2023-09-11 DIAGNOSIS — Z0000 Encounter for general adult medical examination without abnormal findings: Secondary | ICD-10-CM

## 2023-09-11 DIAGNOSIS — N401 Enlarged prostate with lower urinary tract symptoms: Secondary | ICD-10-CM | POA: Diagnosis not present

## 2023-09-11 DIAGNOSIS — H9319 Tinnitus, unspecified ear: Secondary | ICD-10-CM

## 2023-09-11 NOTE — Addendum Note (Signed)
 Addended by: Eual Fines on: 09/11/2023 04:06 PM   Modules accepted: Orders

## 2023-09-11 NOTE — Assessment & Plan Note (Signed)
 Healthy Discussed more consistent exercise Shingrix today Prefers no update of flu or COVID vaccines Td next year Colon due in December Will check PSA

## 2023-09-11 NOTE — Progress Notes (Signed)
 Subjective:    Patient ID: David Montes, male    DOB: [birth year], 63 y.o.   MRN: [id number]  HPI Here for physical  No new concerns Ongoing tinnitus---thinks it started after COVID (or the medication he took) No vertigo Some trouble hearing in loud area---no particular ear No headaches in general  Walks a lot--no set exercise Weight is stable  Current Outpatient Medications on File Prior to Visit  Medication Sig Dispense Refill   aspirin-acetaminophen-caffeine (EXCEDRIN MIGRAINE) 250-250-65 MG tablet Take 1 tablet by mouth every 6 (six) hours as needed for headache.     naproxen sodium (ALEVE) 220 MG tablet Take 220 mg by mouth as needed.     No current facility-administered medications on file prior to visit.    No Known Allergies  Past Medical History:  Diagnosis Date   Allergy     Past Surgical History:  Procedure Laterality Date   ANTERIOR CRUCIATE LIGAMENT REPAIR     TONSILLECTOMY      Family History  Problem Relation Age of Onset   Graves' disease Mother    Atrial fibrillation Mother    Bullous pemphigoid Mother    Lung cancer Father        smoker   Diabetes Maternal Grandmother    Heart disease Paternal Grandmother        CHF   Colon cancer Neg Hx    Esophageal cancer Neg Hx    Stomach cancer Neg Hx    Rectal cancer Neg Hx     Social History   Socioeconomic History   Marital status: Married    Spouse name: Not on file   Number of children: 2   Years of education: Not on file   Highest education level: Not on file  Occupational History   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  Tobacco Use   Smoking status: Never    Passive exposure: Never   Smokeless tobacco: Never  Vaping Use   Vaping status: Never Used  Substance and Sexual Activity   Alcohol use: Yes    Alcohol/week: 1.0 standard drink of alcohol    Types: 1 Standard drinks or equivalent per week   Drug use: No   Sexual activity: Yes  Other Topics Concern   Not on  file  Social History Narrative      Social Drivers of Health   Financial Resource Strain: Not on file  Food Insecurity: Not on file  Transportation Needs: Not on file  Physical Activity: Not on file  Stress: Not on file  Social Connections: Not on file  Intimate Partner Violence: Not on file   Review of Systems  Constitutional:  Negative for fatigue and unexpected weight change.       Wears seat belt  HENT:  Positive for hearing loss and tinnitus. Negative for dental problem.        Keeps up with dentist  Eyes:  Negative for visual disturbance.       No diplopia or unilateral vision loss  Respiratory:  Negative for cough, chest tightness and shortness of breath.   Cardiovascular:  Negative for chest pain and palpitations.       Rare ankle swelling---like if plays 36 holes of golf in a day  Gastrointestinal:  Negative for blood in stool and constipation.       Did have bout of pain after seeds---?diverticular  Rare heartburn---nexium OTC will help  Endocrine: Negative for polydipsia and polyuria.  Genitourinary:  Positive for  frequency.       Slow at times---not bad No sexual problems  Musculoskeletal:  Negative for arthralgias, back pain and joint swelling.       Occ neck pain--if sleeps on it wrong  Skin:  Negative for rash.  Allergic/Immunologic: Positive for environmental allergies. Negative for immunocompromised state.       Uses OTC antihistamine prn in spring and fall  Neurological:  Negative for dizziness, syncope and light-headedness.       Rare headaches  Hematological:  Negative for adenopathy. Does not bruise/bleed easily.  Psychiatric/Behavioral:  Negative for dysphoric mood and sleep disturbance. The patient is not nervous/anxious.        Some stress with oldest son       Objective:   Physical Exam Constitutional:      Appearance: Normal appearance.  HENT:     Mouth/Throat:     Pharynx: No oropharyngeal exudate or posterior oropharyngeal erythema.   Eyes:     Conjunctiva/sclera: Conjunctivae normal.     Pupils: Pupils are equal, round, and reactive to light.  Cardiovascular:     Rate and Rhythm: Normal rate and regular rhythm.     Pulses: Normal pulses.     Heart sounds: No murmur heard.    No gallop.  Pulmonary:     Effort: Pulmonary effort is normal.     Breath sounds: Normal breath sounds. No wheezing or rales.  Abdominal:     Palpations: Abdomen is soft.     Tenderness: There is no abdominal tenderness.  Musculoskeletal:     Cervical back: Neck supple.     Right lower leg: No edema.     Left lower leg: No edema.  Lymphadenopathy:     Cervical: No cervical adenopathy.  Skin:    Findings: No lesion or rash.  Neurological:     General: No focal deficit present.     Mental Status: He is alert and oriented to person, place, and time.  Psychiatric:        Mood and Affect: Mood normal.        Behavior: Behavior normal.            Assessment & Plan:

## 2023-09-11 NOTE — Assessment & Plan Note (Signed)
 No other major symptoms If worsens, would set up ENT evaluation

## 2023-09-11 NOTE — Assessment & Plan Note (Signed)
 Still mild symptoms Consider tamsulosin if worsens

## 2023-09-12 ENCOUNTER — Encounter: Payer: Self-pay | Admitting: Internal Medicine

## 2023-09-12 LAB — COMPREHENSIVE METABOLIC PANEL
ALT: 22 U/L (ref 0–53)
AST: 20 U/L (ref 0–37)
Albumin: 4.3 g/dL (ref 3.5–5.2)
Alkaline Phosphatase: 65 U/L (ref 39–117)
BUN: 17 mg/dL (ref 6–23)
CO2: 31 meq/L (ref 19–32)
Calcium: 9 mg/dL (ref 8.4–10.5)
Chloride: 105 meq/L (ref 96–112)
Creatinine, Ser: 1.07 mg/dL (ref 0.40–1.50)
GFR: 75.39 mL/min (ref >60.00)
Glucose, Bld: 105 mg/dL — ABNORMAL HIGH (ref 70–99)
Potassium: 3.6 meq/L (ref 3.5–5.1)
Sodium: 143 meq/L (ref 135–145)
Total Bilirubin: 0.5 mg/dL (ref 0.2–1.2)
Total Protein: 6.5 g/dL (ref 6.0–8.3)

## 2023-09-12 LAB — CBC
HCT: 45.3 % (ref 39.0–52.0)
Hemoglobin: 15.2 g/dL (ref 13.0–17.0)
MCHC: 33.6 g/dL (ref 30.0–36.0)
MCV: 91.9 fL (ref 78.0–100.0)
Platelets: 230 10*3/uL (ref 150.0–400.0)
RBC: 4.94 Mil/uL (ref 4.22–5.81)
RDW: 13.1 % (ref 11.5–15.5)
WBC: 6.4 10*3/uL (ref 4.0–10.5)

## 2023-09-12 LAB — PSA: PSA: 3.65 ng/mL (ref 0.10–4.00)

## 2023-11-21 ENCOUNTER — Ambulatory Visit (INDEPENDENT_AMBULATORY_CARE_PROVIDER_SITE_OTHER): Payer: No Typology Code available for payment source

## 2023-11-21 DIAGNOSIS — Z23 Encounter for immunization: Secondary | ICD-10-CM

## 2024-07-29 ENCOUNTER — Other Ambulatory Visit: Payer: Self-pay

## 2024-07-29 ENCOUNTER — Encounter: Payer: Self-pay | Admitting: Emergency Medicine

## 2024-07-29 DIAGNOSIS — R10A2 Flank pain, left side: Secondary | ICD-10-CM | POA: Diagnosis present

## 2024-07-29 DIAGNOSIS — K5730 Diverticulosis of large intestine without perforation or abscess without bleeding: Secondary | ICD-10-CM | POA: Diagnosis not present

## 2024-07-29 DIAGNOSIS — N132 Hydronephrosis with renal and ureteral calculous obstruction: Secondary | ICD-10-CM | POA: Diagnosis not present

## 2024-07-29 NOTE — ED Triage Notes (Signed)
 Patient ambulatory to triage with steady gait, without difficulty or distress noted; pt reports left lower abd pain radiating into flank since 6pm accomp by nausea; denies hx of same

## 2024-07-30 ENCOUNTER — Emergency Department

## 2024-07-30 ENCOUNTER — Emergency Department
Admission: EM | Admit: 2024-07-30 | Discharge: 2024-07-30 | Disposition: A | Attending: Emergency Medicine | Admitting: Emergency Medicine

## 2024-07-30 DIAGNOSIS — N200 Calculus of kidney: Secondary | ICD-10-CM

## 2024-07-30 LAB — CBC WITH DIFFERENTIAL/PLATELET
Abs Immature Granulocytes: 0.02 K/uL (ref 0.00–0.07)
Basophils Absolute: 0 K/uL (ref 0.0–0.1)
Basophils Relative: 0 %
Eosinophils Absolute: 0 K/uL (ref 0.0–0.5)
Eosinophils Relative: 0 %
HCT: 42.6 % (ref 39.0–52.0)
Hemoglobin: 14.4 g/dL (ref 13.0–17.0)
Immature Granulocytes: 0 %
Lymphocytes Relative: 11 %
Lymphs Abs: 1.1 K/uL (ref 0.7–4.0)
MCH: 30.6 pg (ref 26.0–34.0)
MCHC: 33.8 g/dL (ref 30.0–36.0)
MCV: 90.4 fL (ref 80.0–100.0)
Monocytes Absolute: 0.6 K/uL (ref 0.1–1.0)
Monocytes Relative: 7 %
Neutro Abs: 7.6 K/uL (ref 1.7–7.7)
Neutrophils Relative %: 82 %
Platelets: 213 K/uL (ref 150–400)
RBC: 4.71 MIL/uL (ref 4.22–5.81)
RDW: 12.1 % (ref 11.5–15.5)
WBC: 9.5 K/uL (ref 4.0–10.5)
nRBC: 0 % (ref 0.0–0.2)

## 2024-07-30 LAB — COMPREHENSIVE METABOLIC PANEL WITH GFR
ALT: 22 U/L (ref 0–44)
AST: 27 U/L (ref 15–41)
Albumin: 4.5 g/dL (ref 3.5–5.0)
Alkaline Phosphatase: 76 U/L (ref 38–126)
Anion gap: 11 (ref 5–15)
BUN: 26 mg/dL — ABNORMAL HIGH (ref 8–23)
CO2: 26 mmol/L (ref 22–32)
Calcium: 9.5 mg/dL (ref 8.9–10.3)
Chloride: 103 mmol/L (ref 98–111)
Creatinine, Ser: 1.24 mg/dL (ref 0.61–1.24)
GFR, Estimated: >60 mL/min
Glucose, Bld: 135 mg/dL — ABNORMAL HIGH (ref 70–99)
Potassium: 4.4 mmol/L (ref 3.5–5.1)
Sodium: 140 mmol/L (ref 135–145)
Total Bilirubin: 0.5 mg/dL (ref 0.0–1.2)
Total Protein: 6.9 g/dL (ref 6.5–8.1)

## 2024-07-30 LAB — URINALYSIS, ROUTINE W REFLEX MICROSCOPIC
Bilirubin Urine: NEGATIVE
Glucose, UA: NEGATIVE mg/dL
Ketones, ur: 5 mg/dL — AB
Leukocytes,Ua: NEGATIVE
Nitrite: NEGATIVE
Protein, ur: NEGATIVE mg/dL
Specific Gravity, Urine: 1.02 (ref 1.005–1.030)
Squamous Epithelial / HPF: 0 /HPF (ref 0–5)
pH: 7 (ref 5.0–8.0)

## 2024-07-30 LAB — LIPASE, BLOOD: Lipase: 37 U/L (ref 11–51)

## 2024-07-30 MED ORDER — ONDANSETRON 4 MG PO TBDP: 4.0000 mg | ORAL_TABLET | Freq: Four times a day (QID) | ORAL | 0 refills | Status: AC | PRN

## 2024-07-30 MED ORDER — KETOROLAC TROMETHAMINE 30 MG/ML IJ SOLN
30.0000 mg | Freq: Once | INTRAMUSCULAR | Status: AC
  Administered 2024-07-30: 30 mg via INTRAVENOUS
  Filled 2024-07-30: qty 1

## 2024-07-30 MED ORDER — OXYCODONE-ACETAMINOPHEN 5-325 MG PO TABS: 2.0000 | ORAL_TABLET | Freq: Once | ORAL | Status: DC

## 2024-07-30 MED ORDER — OXYCODONE HCL 5 MG PO TABS: 5.0000 mg | ORAL_TABLET | Freq: Three times a day (TID) | ORAL | 0 refills | Status: AC | PRN

## 2024-07-30 MED ORDER — TAMSULOSIN HCL 0.4 MG PO CAPS: 0.4000 mg | ORAL_CAPSULE | Freq: Every day | ORAL | 0 refills | Status: DC

## 2024-07-30 MED ORDER — ONDANSETRON HCL 4 MG/2ML IJ SOLN
4.0000 mg | Freq: Once | INTRAMUSCULAR | Status: AC
  Administered 2024-07-30: 4 mg via INTRAVENOUS
  Filled 2024-07-30: qty 2

## 2024-07-30 MED ORDER — MORPHINE SULFATE (PF) 4 MG/ML IV SOLN
4.0000 mg | Freq: Once | INTRAVENOUS | Status: AC
  Administered 2024-07-30: 4 mg via INTRAVENOUS
  Filled 2024-07-30: qty 1

## 2024-07-30 MED ORDER — IBUPROFEN 800 MG PO TABS: 800.0000 mg | ORAL_TABLET | Freq: Three times a day (TID) | ORAL | 0 refills | Status: AC | PRN

## 2024-07-30 MED ORDER — SODIUM CHLORIDE 0.9 % IV BOLUS (SEPSIS)
1000.0000 mL | Freq: Once | INTRAVENOUS | Status: AC
  Administered 2024-07-30: 1000 mL via INTRAVENOUS

## 2024-07-30 MED ORDER — ONDANSETRON 4 MG PO TBDP: 4.0000 mg | ORAL_TABLET | Freq: Once | ORAL | Status: DC

## 2024-07-30 MED ORDER — IBUPROFEN 800 MG PO TABS: 800.0000 mg | ORAL_TABLET | Freq: Once | ORAL | Status: DC

## 2024-07-30 NOTE — ED Notes (Signed)
 Reviewed D/C information with the patient, pt verbalized understanding. No additional concerns at this time. Strainer provided.

## 2024-07-30 NOTE — Discharge Instructions (Addendum)

## 2024-07-30 NOTE — ED Notes (Signed)
 Water tolerated

## 2024-07-30 NOTE — ED Provider Notes (Signed)
 "  [facility] Provider Note    Event Date/Time   First MD Initiated Contact with Patient 07/30/24 0153     (approximate)   History   Abdominal Pain   HPI  David Montes is a 64 y.o. male with no significant past medical history who presents to the emergency department with left flank pain, nausea and vomiting that started today.  No prior history of kidney stone.  No dysuria or hematuria.  No diarrhea.  No prior abdominal surgery.   History provided by patient and wife.    Past Medical History:  Diagnosis Date   Allergy     Past Surgical History:  Procedure Laterality Date   ANTERIOR CRUCIATE LIGAMENT REPAIR     TONSILLECTOMY      MEDICATIONS:  Prior to Admission medications  Medication Sig Start Date End Date Taking? Authorizing Provider  aspirin-acetaminophen -caffeine (EXCEDRIN MIGRAINE) 250-250-65 MG tablet Take 1 tablet by mouth every 6 (six) hours as needed for headache.    [provider]  naproxen sodium (ALEVE) 220 MG tablet Take 220 mg by mouth as needed.    [provider]    Physical Exam   Triage Vital Signs: ED Triage Vitals  Encounter Vitals Group     BP 07/29/24 2357 135/87     Girls Systolic BP Percentile --      Girls Diastolic BP Percentile --      Boys Systolic BP Percentile --      Boys Diastolic BP Percentile --      Pulse Rate 07/29/24 2357 83     Resp 07/29/24 2357 18     Temp 07/29/24 2357 (!) 97.5 F (36.4 C)     Temp Source 07/29/24 2357 Oral     SpO2 07/29/24 2357 95 %     Weight 07/29/24 2347 157 lb (71.2 kg)     Height 07/29/24 2347 5' 8 (1.727 m)     Head Circumference --      Peak Flow --      Pain Score 07/29/24 2346 7     Pain Loc --      Pain Education --      Exclude from Growth Chart --     Most recent vital signs: Vitals:   07/30/24 0210 07/30/24 0330  BP: 138/84 126/80  Pulse: 94 92  Resp: 18 18  Temp: 97.9 F (36.6 C)   SpO2: 93% 97%    CONSTITUTIONAL: Alert,  responds appropriately to questions.  Appears uncomfortable HEAD: Normocephalic, atraumatic EYES: Conjunctivae clear, pupils appear equal, sclera nonicteric ENT: normal nose; moist mucous membranes NECK: Supple, normal ROM CARD: RRR; S1 and S2 appreciated RESP: Normal chest excursion without splinting or tachypnea; breath sounds clear and equal bilaterally; no wheezes, no rhonchi, no rales, no hypoxia or respiratory distress, speaking full sentences ABD/GI: Non-distended; soft, non-tender, no rebound, no guarding, no peritoneal signs BACK: The back appears normal, no midline spinal tenderness or step-off or deformity EXT: Normal ROM in all joints; no deformity noted, no edema SKIN: Normal color for age and race; warm; no rash on exposed skin NEURO: Moves all extremities equally, normal speech PSYCH: The patient's mood and manner are appropriate.   ED Results / Procedures / Treatments   LABS: (all labs ordered are listed, but only abnormal results are displayed) Labs Reviewed  COMPREHENSIVE METABOLIC PANEL WITH GFR - Abnormal; Notable for the following components:      Result Value   Glucose, Bld 135 (*)  BUN 26 (*)    All other components within normal limits  URINALYSIS, ROUTINE W REFLEX MICROSCOPIC - Abnormal; Notable for the following components:   Color, Urine YELLOW (*)    APPearance HAZY (*)    Hgb urine dipstick MODERATE (*)    Ketones, ur 5 (*)    Bacteria, UA RARE (*)    All other components within normal limits  CBC WITH DIFFERENTIAL/PLATELET  LIPASE, BLOOD     EKG:  EKG Interpretation Date/Time:    Ventricular Rate:    PR Interval:    QRS Duration:    QT Interval:    QTC Calculation:   R Axis:      Text Interpretation:           RADIOLOGY: My personal review and interpretation of imaging: Left ureteral stone.  I have personally reviewed all radiology reports.   CT Renal Stone Study Result Date: 07/30/2024 EXAM: CT ABDOMEN AND PELVIS WITHOUT  CONTRAST 07/30/2024 01:07:16 AM TECHNIQUE: CT of the abdomen and pelvis was performed without the administration of intravenous contrast. Multiplanar reformatted images are provided for review. Automated exposure control, iterative reconstruction, and/or weight-based adjustment of the mA/kV was utilized to reduce the radiation dose to as low as reasonably achievable. COMPARISON: None available. CLINICAL HISTORY: Abdominal and flank pain, left lower quadrant abdominal pain, nausea. FINDINGS: LOWER CHEST: No acute abnormality. LIVER: The liver is unremarkable. GALLBLADDER AND BILE DUCTS: Gallbladder is unremarkable. No biliary ductal dilatation. SPLEEN: No acute abnormality. PANCREAS: No acute abnormality. ADRENAL GLANDS: No acute abnormality. KIDNEYS, URETERS AND BLADDER: Nonobstructing 5 mm calculi are seen within the interpolar region of the kidneys bilaterally. An obstructing 5 mm calculus is seen within the left ureteropelvic junction resulting in mild left hydronephrosis and moderate left perinephric stranding. The kidneys are normal in size and position. No hydronephrosis on the right. No ureteral calculi on the right. No perinephric fluid collections. Urinary bladder is unremarkable. GI AND BOWEL: Moderate sigmoid diverticulosis without superimposed acute inflammatory change. Small focus of fat necrosis noted adjacent to the distal sigmoid colon. The stomach, small bowel, and large bowel are otherwise unremarkable. Appendix normal. There is no bowel obstruction. PERITONEUM AND RETROPERITONEUM: No ascites. No free air. VASCULATURE: Aorta is normal in caliber. Mild aortoiliac atherosclerotic calcification. LYMPH NODES: No lymphadenopathy. REPRODUCTIVE ORGANS: Benign prostatic hypertrophy. No acute abnormality. BONES AND SOFT TISSUES: No acute osseous abnormality. No focal soft tissue abnormality. IMPRESSION: 1. Obstructing 5 mm calculus at the left ureteropelvic junction resulting in mild left hydronephrosis and  moderate left perinephric stranding. 2. Nonobstructing 5 mm calculi within the interpolar region of the kidneys bilaterally. 3. Moderate sigmoid diverticulosis without superimposed acute inflammatory change. Small focus of fat necrosis adjacent to the distal sigmoid colon in keeping with remote inflammation . 4. RAF score includes Aortic atherosclerosis (ICD10-I70.0). Electronically signed by: Dorethia Molt MD MD 07/30/2024 01:17 AM EST RP Workstation: [id number]     PROCEDURES:  Critical Care performed: No     Procedures    IMPRESSION / MDM / ASSESSMENT AND PLAN / ED COURSE  I reviewed the triage vital signs and the nursing notes.    Patient here with left-sided flank pain, vomiting.    DIFFERENTIAL DIAGNOSIS (includes but not limited to):   Kidney stone, UTI, pyelonephritis, musculoskeletal back pain, doubt cauda equina, epidural abscess or hematoma, discitis or osteomyelitis, transverse myelitis, fracture   Patient's presentation is most consistent with acute presentation with potential threat to life or bodily function.   PLAN: Workup  initiated from triage.  No leukocytosis.  Normal creatinine, LFTs and lipase.  Urine shows small amount of blood but no other sign of infection.  CT scan reviewed and interpreted by myself and the radiologist and shows 5 mm stone at the left UPJ with mild hydronephrosis.  He also has diverticulosis without diverticulitis.  Will give IV pain and nausea medicine here and reassess.   MEDICATIONS GIVEN IN ED: Medications  morphine  (PF) 4 MG/ML injection 4 mg (4 mg Intravenous Given 07/30/24 0232)  ondansetron  (ZOFRAN ) injection 4 mg (4 mg Intravenous Given 07/30/24 0232)  ketorolac  (TORADOL ) 30 MG/ML injection 30 mg (30 mg Intravenous Given 07/30/24 0232)  sodium chloride  0.9 % bolus 1,000 mL (0 mLs Intravenous Stopped 07/30/24 0324)     ED COURSE: Patient reports feeling better.  Tolerating p.o.  I feel he is safe for discharge.  Will discharge  with Flomax , pain and nausea medicine, urine strainer and outpatient urology follow-up.  At this time, I do not feel there is any life-threatening condition present. I reviewed all nursing notes, vitals, pertinent previous records.  All lab and urine results, EKGs, imaging ordered have been independently reviewed and interpreted by myself.  I reviewed all available radiology reports from any imaging ordered this visit.  Based on my assessment, I feel the patient is safe to be discharged home without further emergent workup and can continue workup as an outpatient as needed. Discussed all findings, treatment plan as well as usual and customary return precautions.  They verbalize understanding and are comfortable with this plan.  Outpatient follow-up has been provided as needed.  All questions have been answered.    CONSULTS:  none   OUTSIDE RECORDS REVIEWED: Reviewed recent family medicine notes.       FINAL CLINICAL IMPRESSION(S) / ED DIAGNOSES   Final diagnoses:  Kidney stone     Rx / DC Orders   ED Discharge Orders          Ordered    ibuprofen  (ADVIL ) 800 MG tablet  Every 8 hours PRN        07/30/24 0310    oxyCODONE  (ROXICODONE ) 5 MG immediate release tablet  Every 8 hours PRN        07/30/24 0310    ondansetron  (ZOFRAN -ODT) 4 MG disintegrating tablet  Every 6 hours PRN        07/30/24 0310    tamsulosin  (FLOMAX ) 0.4 MG CAPS capsule  Daily        07/30/24 0310             Note:  This document was prepared using Dragon voice recognition software and may include unintentional dictation errors.   Dinora Hemm, Josette SAILOR, DO 07/30/24 0507  "

## 2024-08-12 DIAGNOSIS — N200 Calculus of kidney: Secondary | ICD-10-CM | POA: Insufficient documentation

## 2024-08-12 NOTE — Assessment & Plan Note (Signed)
 5mm Left proximal ureteral stone (dx 07/30/24)   - bilateral nonobstructing ~86mm stones  - visible on KUB  - First lifetime stone event, strong family history of nephrolithiasis  Likely spontaneous passage, now asymptomatic  - Repeat KUB this week to confirm - RTC in 1 year with KUB prior, checkup

## 2024-08-12 NOTE — Progress Notes (Unsigned)
 "  08/15/24 9:42 AM   David Montes 1961-02-27 [id number]   HPI: 64 y.o. male here for initial evaluation of nephrolithiasis  ED visit (07/30/24) - acute Left flank pain  - CT Stone = 5mm Left proximal ureteral stone, bilateral nonobstructing ~48mm stones  First time meeting today, very pleasant gentleman Thinks he passed a stone, brought in a bag Completely asymptomatic now First-time stone event Strong family history of nephrolithiasis (several relatives, including patient's son) No other medical risk factors Works in environmental manager business      PMH: Past Medical History:  Diagnosis Date   Allergy     Surgical History: Past Surgical History:  Procedure Laterality Date   ANTERIOR CRUCIATE LIGAMENT REPAIR     TONSILLECTOMY      Family History: Family History  Problem Relation Age of Onset   Graves' disease Mother    Atrial fibrillation Mother    Bullous pemphigoid Mother    Lung cancer Father        smoker   Diabetes Maternal Grandmother    Heart disease Paternal Grandmother        CHF   Colon cancer Neg Hx    Esophageal cancer Neg Hx    Stomach cancer Neg Hx    Rectal cancer Neg Hx     Social History:  reports that he has never smoked. He has never been exposed to tobacco smoke. He has never used smokeless tobacco. He reports current alcohol use of about 1.0 standard drink of alcohol per week. He reports that he does not use drugs.      Physical Exam: BP (!) 137/90   Pulse 76   Ht 5' 8 (1.727 m)   Wt 161 lb 1.6 oz (73.1 kg)   BMI 24.50 kg/m    Constitutional:  Alert and oriented, No acute distress. Cardiovascular: No clubbing, cyanosis, or edema. Respiratory: Normal respiratory effort, no increased work of breathing. GI: Nondistended Skin: No rashes, bruises or suspicious lesions. Neurologic: Grossly intact, no focal deficits, moving all 4 extremities. Psychiatric: Normal mood and affect.  Laboratory Data:    Component Ref Range & Units  (hover) 13 d ago  Color, Urine YELLOW Abnormal   APPearance HAZY Abnormal   Specific Gravity, Urine 1.020  pH 7.0  Glucose, UA NEGATIVE  Hgb urine dipstick MODERATE Abnormal   Bilirubin Urine NEGATIVE  Ketones, ur 5 Abnormal   Protein, ur NEGATIVE  Nitrite NEGATIVE  Leukocytes,Ua NEGATIVE  RBC / HPF 21-50  WBC, UA 0-5  Bacteria, UA RARE Abnormal   Squamous Epithelial / HPF 0  Mucus PRESENT     Pertinent Imaging: I have personally viewed and interpreted the CT Stone (Jan 2026) - 5mm obstructing Left proximal ureteral stone with mild hydro. Bilateral interpolar renal stones up to ~69mm each.     Assessment & Plan:    Nephrolithiasis Assessment & Plan: 5mm Left proximal ureteral stone (dx 07/30/24)   - bilateral nonobstructing ~54mm stones  - visible on KUB  - First lifetime stone event, strong family history of nephrolithiasis  Likely spontaneous passage, now asymptomatic  - Repeat KUB this week to confirm - RTC in 1 year with KUB prior, checkup  Orders: -     Urinalysis, Complete -     DG Abd 1 View; Future -     Stone Analysis  Benign prostatic hyperplasia with lower urinary tract symptoms, symptom details unspecified Assessment & Plan: Mild BPH LUTS  -Recent Flomax  for kidney stone seem to  help with urination  Today we reviewed the physiology and common causes of male lower urinary tract symptoms (LUTS). Discussed potential etiologies including infectious, inflammatory, bladder-related, benign prostatic hyperplasia (BPH), and musculoskeletal/pelvic floor contributions. Reviewed the standard diagnostic workup (urinalysis, PVR, uroflow, prostate assessment, possible cystoscopy or imaging) and the spectrum of initial management strategies ranging from behavioral and lifestyle measures to pharmacologic therapy, with procedural options if indicated. All questions were addressed and the patient expressed understanding of the evaluation and treatment pathway.  - Will write  for formal prescription for Flomax  for BPH.  Seem to improve some urinary habits and quality of life  Orders: -     Tamsulosin  HCl; Take 1 capsule (0.4 mg total) by mouth daily.  Dispense: 90 capsule; Refill: 3      Penne Skye, MD 08/15/2024  [facility] [address] [place], [place] [address] [phone] "

## 2024-08-15 ENCOUNTER — Ambulatory Visit
Admission: RE | Admit: 2024-08-15 | Discharge: 2024-08-15 | Disposition: A | Discharge status: Home / Self Care | Source: Ambulatory Visit | Attending: Urology | Admitting: Urology

## 2024-08-15 ENCOUNTER — Encounter: Payer: Self-pay | Admitting: Urology

## 2024-08-15 ENCOUNTER — Ambulatory Visit: Admitting: Urology

## 2024-08-15 VITALS — BP 137/90 | HR 76 | Ht 68.0 in | Wt 161.1 lb

## 2024-08-15 DIAGNOSIS — N200 Calculus of kidney: Secondary | ICD-10-CM

## 2024-08-15 DIAGNOSIS — N401 Enlarged prostate with lower urinary tract symptoms: Secondary | ICD-10-CM

## 2024-08-15 LAB — URINALYSIS, COMPLETE
Bilirubin, UA: NEGATIVE
Glucose, UA: NEGATIVE
Ketones, UA: NEGATIVE
Leukocytes,UA: NEGATIVE
Nitrite, UA: NEGATIVE
Protein,UA: NEGATIVE
RBC, UA: NEGATIVE
Specific Gravity, UA: 1.03 (ref 1.005–1.030)
Urobilinogen, Ur: 0.2 mg/dL (ref 0.2–1.0)
pH, UA: 5 (ref 5.0–7.5)

## 2024-08-15 LAB — MICROSCOPIC EXAMINATION

## 2024-08-15 MED ORDER — TAMSULOSIN HCL 0.4 MG PO CAPS: 0.4000 mg | ORAL_CAPSULE | Freq: Every day | ORAL | 3 refills | Status: AC

## 2024-08-15 NOTE — Assessment & Plan Note (Signed)
 Mild BPH LUTS  -Recent Flomax  for kidney stone seem to help with urination  Today we reviewed the physiology and common causes of male lower urinary tract symptoms (LUTS). Discussed potential etiologies including infectious, inflammatory, bladder-related, benign prostatic hyperplasia (BPH), and musculoskeletal/pelvic floor contributions. Reviewed the standard diagnostic workup (urinalysis, PVR, uroflow, prostate assessment, possible cystoscopy or imaging) and the spectrum of initial management strategies ranging from behavioral and lifestyle measures to pharmacologic therapy, with procedural options if indicated. All questions were addressed and the patient expressed understanding of the evaluation and treatment pathway.  - Will write for formal prescription for Flomax  for BPH.  Seem to improve some urinary habits and quality of life

## 2024-09-12 ENCOUNTER — Encounter: Payer: No Typology Code available for payment source | Admitting: Nurse Practitioner

## 2025-08-14 ENCOUNTER — Ambulatory Visit: Admitting: Urology
